# Patient Record
Sex: Female | Born: 1980 | Race: Black or African American | Hispanic: No | Marital: Married | Smoking: Never smoker
Health system: Southern US, Community
[De-identification: ages and names within clinical notes are randomized; demographics above are authoritative.]

## PROBLEM LIST (undated history)

## (undated) DIAGNOSIS — R51 Headache: Secondary | ICD-10-CM

## (undated) DIAGNOSIS — D649 Anemia, unspecified: Secondary | ICD-10-CM

## (undated) DIAGNOSIS — G4700 Insomnia, unspecified: Secondary | ICD-10-CM

---
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
## 2001-04-17 ENCOUNTER — Emergency Department (HOSPITAL_COMMUNITY): Admission: EM | Admit: 2001-04-17 | Discharge: 2001-04-17 | Payer: Self-pay | Admitting: *Deleted

## 2005-11-27 HISTORY — PX: EYE SURGERY: SHX253

## 2009-10-08 ENCOUNTER — Encounter: Payer: Self-pay | Admitting: Internal Medicine

## 2009-10-11 ENCOUNTER — Ambulatory Visit: Payer: Self-pay | Admitting: Internal Medicine

## 2009-10-11 DIAGNOSIS — D509 Iron deficiency anemia, unspecified: Secondary | ICD-10-CM

## 2009-10-11 DIAGNOSIS — I10 Essential (primary) hypertension: Secondary | ICD-10-CM | POA: Insufficient documentation

## 2009-10-11 LAB — CONVERTED CEMR LAB
Alkaline Phosphatase: 89 units/L (ref 39–117)
Basophils Absolute: 0 10*3/uL (ref 0.0–0.1)
Basophils Relative: 0.3 % (ref 0.0–3.0)
Bilirubin, Direct: 0.1 mg/dL (ref 0.0–0.3)
CO2: 26 meq/L (ref 19–32)
Calcium: 9.1 mg/dL (ref 8.4–10.5)
Creatinine, Ser: 0.7 mg/dL (ref 0.4–1.2)
Eosinophils Absolute: 0 10*3/uL (ref 0.0–0.7)
Lymphocytes Relative: 27.5 % (ref 12.0–46.0)
MCHC: 32.4 g/dL (ref 30.0–36.0)
Neutrophils Relative %: 65 % (ref 43.0–77.0)
RBC: 4.85 M/uL (ref 3.87–5.11)
RDW: 16.9 % — ABNORMAL HIGH (ref 11.5–14.6)
Specific Gravity, Urine: >=1.03 (ref 1.000–1.030)
Total Bilirubin: 0.7 mg/dL (ref 0.3–1.2)
Total Protein, Urine: NEGATIVE mg/dL
Urine Glucose: NEGATIVE mg/dL

## 2009-10-12 ENCOUNTER — Encounter: Payer: Self-pay | Admitting: Internal Medicine

## 2010-05-31 ENCOUNTER — Inpatient Hospital Stay (HOSPITAL_COMMUNITY): Admission: AD | Admit: 2010-05-31 | Discharge: 2010-05-31 | Payer: Self-pay | Admitting: Obstetrics and Gynecology

## 2010-05-31 ENCOUNTER — Ambulatory Visit: Payer: Self-pay | Admitting: Gynecology

## 2010-07-30 ENCOUNTER — Inpatient Hospital Stay (HOSPITAL_COMMUNITY): Admission: AD | Admit: 2010-07-30 | Discharge: 2010-08-01 | Payer: Self-pay | Admitting: Obstetrics and Gynecology

## 2010-08-08 ENCOUNTER — Inpatient Hospital Stay (HOSPITAL_COMMUNITY): Admission: RE | Admit: 2010-08-08 | Discharge: 2010-08-12 | Payer: Self-pay | Admitting: Obstetrics & Gynecology

## 2010-08-09 ENCOUNTER — Encounter (INDEPENDENT_AMBULATORY_CARE_PROVIDER_SITE_OTHER): Payer: Self-pay | Admitting: Obstetrics & Gynecology

## 2010-11-24 ENCOUNTER — Ambulatory Visit (HOSPITAL_COMMUNITY)
Admission: RE | Admit: 2010-11-24 | Discharge: 2010-11-24 | Payer: Self-pay | Source: Home / Self Care | Attending: Obstetrics and Gynecology | Admitting: Obstetrics and Gynecology

## 2010-11-24 ENCOUNTER — Encounter (INDEPENDENT_AMBULATORY_CARE_PROVIDER_SITE_OTHER): Payer: Self-pay | Admitting: Obstetrics and Gynecology

## 2010-11-27 HISTORY — PX: WISDOM TOOTH EXTRACTION: SHX21

## 2011-02-09 LAB — COMPREHENSIVE METABOLIC PANEL
ALT: 38 U/L — ABNORMAL HIGH (ref 0–35)
ALT: 41 U/L — ABNORMAL HIGH (ref 0–35)
ALT: 45 U/L — ABNORMAL HIGH (ref 0–35)
AST: 33 U/L (ref 0–37)
AST: 39 U/L — ABNORMAL HIGH (ref 0–37)
AST: 41 U/L — ABNORMAL HIGH (ref 0–37)
AST: 41 U/L — ABNORMAL HIGH (ref 0–37)
AST: 49 U/L — ABNORMAL HIGH (ref 0–37)
AST: 63 U/L — ABNORMAL HIGH (ref 0–37)
AST: 75 U/L — ABNORMAL HIGH (ref 0–37)
Albumin: 2.4 g/dL — ABNORMAL LOW (ref 3.5–5.2)
Albumin: 2.5 g/dL — ABNORMAL LOW (ref 3.5–5.2)
Albumin: 2.8 g/dL — ABNORMAL LOW (ref 3.5–5.2)
Albumin: 2.8 g/dL — ABNORMAL LOW (ref 3.5–5.2)
Albumin: 3 g/dL — ABNORMAL LOW (ref 3.5–5.2)
Albumin: 3.1 g/dL — ABNORMAL LOW (ref 3.5–5.2)
Alkaline Phosphatase: 148 U/L — ABNORMAL HIGH (ref 39–117)
Alkaline Phosphatase: 167 U/L — ABNORMAL HIGH (ref 39–117)
Alkaline Phosphatase: 211 U/L — ABNORMAL HIGH (ref 39–117)
BUN: 7 mg/dL (ref 6–23)
BUN: 6 mg/dL (ref 6–23)
BUN: 8 mg/dL (ref 6–23)
BUN: 9 mg/dL (ref 6–23)
CO2: 20 mEq/L (ref 19–32)
CO2: 20 mEq/L (ref 19–32)
CO2: 21 mEq/L (ref 19–32)
CO2: 22 mEq/L (ref 19–32)
CO2: 24 mEq/L (ref 19–32)
CO2: 24 mEq/L (ref 19–32)
Calcium: 8.3 mg/dL — ABNORMAL LOW (ref 8.4–10.5)
Calcium: 8.4 mg/dL (ref 8.4–10.5)
Calcium: 8.9 mg/dL (ref 8.4–10.5)
Calcium: 9 mg/dL (ref 8.4–10.5)
Calcium: 9.1 mg/dL (ref 8.4–10.5)
Calcium: 9.1 mg/dL (ref 8.4–10.5)
Calcium: 9.1 mg/dL (ref 8.4–10.5)
Chloride: 105 mEq/L (ref 96–112)
Chloride: 106 mEq/L (ref 96–112)
Chloride: 108 mEq/L (ref 96–112)
Chloride: 109 mEq/L (ref 96–112)
Creatinine, Ser: 0.63 mg/dL (ref 0.4–1.2)
Creatinine, Ser: 0.7 mg/dL (ref 0.4–1.2)
Creatinine, Ser: 0.71 mg/dL (ref 0.4–1.2)
Creatinine, Ser: 0.73 mg/dL (ref 0.4–1.2)
GFR calc Af Amer: >60 mL/min (ref >60)
GFR calc Af Amer: >60 mL/min (ref >60)
GFR calc Af Amer: >60 mL/min (ref >60)
GFR calc Af Amer: >60 mL/min (ref >60)
GFR calc Af Amer: >60 mL/min (ref >60)
GFR calc Af Amer: >60 mL/min (ref >60)
GFR calc Af Amer: >60 mL/min (ref >60)
GFR calc non Af Amer: >60 mL/min (ref >60)
GFR calc non Af Amer: >60 mL/min (ref >60)
GFR calc non Af Amer: >60 mL/min (ref >60)
GFR calc non Af Amer: >60 mL/min (ref >60)
Glucose, Bld: 103 mg/dL — ABNORMAL HIGH (ref 70–99)
Glucose, Bld: 81 mg/dL (ref 70–99)
Glucose, Bld: 87 mg/dL (ref 70–99)
Potassium: 3.5 mEq/L (ref 3.5–5.1)
Potassium: 3.7 mEq/L (ref 3.5–5.1)
Potassium: 3.8 mEq/L (ref 3.5–5.1)
Potassium: 3.8 mEq/L (ref 3.5–5.1)
Potassium: 4 mEq/L (ref 3.5–5.1)
Sodium: 137 mEq/L (ref 135–145)
Sodium: 135 mEq/L (ref 135–145)
Sodium: 135 mEq/L (ref 135–145)
Sodium: 135 mEq/L (ref 135–145)
Sodium: 137 mEq/L (ref 135–145)
Sodium: 140 mEq/L (ref 135–145)
Total Bilirubin: 0.7 mg/dL (ref 0.3–1.2)
Total Bilirubin: 0.8 mg/dL (ref 0.3–1.2)
Total Protein: 5.8 g/dL — ABNORMAL LOW (ref 6.0–8.3)
Total Protein: 6 g/dL (ref 6.0–8.3)
Total Protein: 6.1 g/dL (ref 6.0–8.3)
Total Protein: 6.5 g/dL (ref 6.0–8.3)
Total Protein: 6.7 g/dL (ref 6.0–8.3)

## 2011-02-09 LAB — CBC
HCT: 37.4 % (ref 36.0–46.0)
HCT: 38.1 % (ref 36.0–46.0)
HCT: 38.3 % (ref 36.0–46.0)
Hemoglobin: 10 g/dL — ABNORMAL LOW (ref 12.0–15.0)
Hemoglobin: 12.3 g/dL (ref 12.0–15.0)
Hemoglobin: 12.3 g/dL (ref 12.0–15.0)
Hemoglobin: 12.4 g/dL (ref 12.0–15.0)
Hemoglobin: 12.6 g/dL (ref 12.0–15.0)
Hemoglobin: 12.9 g/dL (ref 12.0–15.0)
Hemoglobin: 13.1 g/dL (ref 12.0–15.0)
MCH: 30.8 pg (ref 26.0–34.0)
MCH: 31.3 pg (ref 26.0–34.0)
MCH: 31.3 pg (ref 26.0–34.0)
MCH: 31.7 pg (ref 26.0–34.0)
MCHC: 32.7 g/dL (ref 30.0–36.0)
MCHC: 32.9 g/dL (ref 30.0–36.0)
MCHC: 32.9 g/dL (ref 30.0–36.0)
MCHC: 33.1 g/dL (ref 30.0–36.0)
MCHC: 34 g/dL (ref 30.0–36.0)
MCV: 93.7 fL (ref 78.0–100.0)
MCV: 92 fL (ref 78.0–100.0)
MCV: 92.8 fL (ref 78.0–100.0)
MCV: 92.8 fL (ref 78.0–100.0)
MCV: 93 fL (ref 78.0–100.0)
Platelets: 244 10*3/uL (ref 150–400)
Platelets: 231 10*3/uL (ref 150–400)
Platelets: 244 10*3/uL (ref 150–400)
Platelets: 244 10*3/uL (ref 150–400)
Platelets: 249 10*3/uL (ref 150–400)
RBC: 3.15 MIL/uL — ABNORMAL LOW (ref 3.87–5.11)
RBC: 4.1 MIL/uL (ref 3.87–5.11)
RBC: 4.12 MIL/uL (ref 3.87–5.11)
RBC: 4.31 MIL/uL (ref 3.87–5.11)
RDW: 15.1 % (ref 11.5–15.5)
RDW: 14.8 % (ref 11.5–15.5)
RDW: 14.8 % (ref 11.5–15.5)
RDW: 14.9 % (ref 11.5–15.5)
RDW: 15.1 % (ref 11.5–15.5)
RDW: 15.1 % (ref 11.5–15.5)
WBC: 14.3 10*3/uL — ABNORMAL HIGH (ref 4.0–10.5)
WBC: 11.9 10*3/uL — ABNORMAL HIGH (ref 4.0–10.5)
WBC: 13.2 10*3/uL — ABNORMAL HIGH (ref 4.0–10.5)
WBC: 14.7 10*3/uL — ABNORMAL HIGH (ref 4.0–10.5)
WBC: 15.7 10*3/uL — ABNORMAL HIGH (ref 4.0–10.5)

## 2011-02-09 LAB — DIFFERENTIAL
Basophils Absolute: 0.1 10*3/uL (ref 0.0–0.1)
Eosinophils Absolute: 0 10*3/uL (ref 0.0–0.7)
Eosinophils Absolute: 0 10*3/uL (ref 0.0–0.7)
Eosinophils Relative: 0 % (ref 0–5)
Eosinophils Relative: 0 % (ref 0–5)
Lymphocytes Relative: 12 % (ref 12–46)
Lymphs Abs: 1.5 10*3/uL (ref 0.7–4.0)
Monocytes Relative: 10 % (ref 3–12)

## 2011-02-09 LAB — PROTEIN, URINE, 24 HOUR
Collection Interval-UPROT: 24 hours
Protein, 24H Urine: 98 mg/d (ref 50–100)
Protein, Urine: 7 mg/dL

## 2011-02-09 LAB — URIC ACID
Uric Acid, Serum: 6.5 mg/dL (ref 2.4–7.0)
Uric Acid, Serum: 5.4 mg/dL (ref 2.4–7.0)
Uric Acid, Serum: 5.5 mg/dL (ref 2.4–7.0)
Uric Acid, Serum: 6.2 mg/dL (ref 2.4–7.0)
Uric Acid, Serum: 6.4 mg/dL (ref 2.4–7.0)

## 2011-02-09 LAB — LACTATE DEHYDROGENASE
LDH: 113 U/L (ref 94–250)
LDH: 151 U/L (ref 94–250)
LDH: 205 U/L (ref 94–250)

## 2011-02-09 LAB — CREATININE CLEARANCE, URINE, 24 HOUR: Creatinine, Urine: 123.3 mg/dL

## 2012-09-17 LAB — OB RESULTS CONSOLE HEPATITIS B SURFACE ANTIGEN: Hepatitis B Surface Ag: NEGATIVE

## 2012-09-17 LAB — OB RESULTS CONSOLE ABO/RH: RH Type: POSITIVE

## 2013-02-04 LAB — OB RESULTS CONSOLE RPR: RPR: NONREACTIVE

## 2013-04-22 ENCOUNTER — Encounter (HOSPITAL_COMMUNITY)
Admission: AD | Disposition: A | Discharge status: Home / Self Care | Payer: Self-pay | Source: Ambulatory Visit | Attending: Obstetrics and Gynecology

## 2013-04-22 ENCOUNTER — Inpatient Hospital Stay (HOSPITAL_COMMUNITY)
Admission: AD | Admit: 2013-04-22 | Discharge: 2013-04-24 | DRG: 370 | Disposition: A | Discharge status: Home / Self Care | Payer: BC Managed Care – PPO | Source: Ambulatory Visit | Attending: Obstetrics and Gynecology | Admitting: Obstetrics and Gynecology

## 2013-04-22 ENCOUNTER — Inpatient Hospital Stay (HOSPITAL_COMMUNITY): Payer: BC Managed Care – PPO | Admitting: Anesthesiology

## 2013-04-22 ENCOUNTER — Encounter (HOSPITAL_COMMUNITY): Payer: Self-pay | Admitting: *Deleted

## 2013-04-22 ENCOUNTER — Encounter (HOSPITAL_COMMUNITY): Payer: Self-pay | Admitting: Anesthesiology

## 2013-04-22 ENCOUNTER — Other Ambulatory Visit: Payer: Self-pay | Admitting: Obstetrics and Gynecology

## 2013-04-22 DIAGNOSIS — D62 Acute posthemorrhagic anemia: Secondary | ICD-10-CM | POA: Diagnosis not present

## 2013-04-22 DIAGNOSIS — I10 Essential (primary) hypertension: Secondary | ICD-10-CM

## 2013-04-22 DIAGNOSIS — O4100X0 Oligohydramnios, unspecified trimester, not applicable or unspecified: Secondary | ICD-10-CM | POA: Diagnosis present

## 2013-04-22 DIAGNOSIS — D509 Iron deficiency anemia, unspecified: Secondary | ICD-10-CM

## 2013-04-22 DIAGNOSIS — O9903 Anemia complicating the puerperium: Secondary | ICD-10-CM | POA: Diagnosis not present

## 2013-04-22 DIAGNOSIS — O34219 Maternal care for unspecified type scar from previous cesarean delivery: Principal | ICD-10-CM | POA: Diagnosis present

## 2013-04-22 DIAGNOSIS — O139 Gestational [pregnancy-induced] hypertension without significant proteinuria, unspecified trimester: Secondary | ICD-10-CM | POA: Diagnosis present

## 2013-04-22 HISTORY — DX: Anemia, unspecified: D64.9

## 2013-04-22 HISTORY — DX: Headache: R51

## 2013-04-22 HISTORY — DX: Insomnia, unspecified: G47.00

## 2013-04-22 LAB — CBC
MCV: 79.2 fL (ref 78.0–100.0)
Platelets: 319 10*3/uL (ref 150–400)
RBC: 4.19 MIL/uL (ref 3.87–5.11)
RDW: 17 % — ABNORMAL HIGH (ref 11.5–15.5)
WBC: 14.6 10*3/uL — ABNORMAL HIGH (ref 4.0–10.5)

## 2013-04-22 LAB — TYPE AND SCREEN
ABO/RH(D): O POS
Antibody Screen: NEGATIVE

## 2013-04-22 LAB — RPR: RPR Ser Ql: NONREACTIVE

## 2013-04-22 SURGERY — Surgical Case
Anesthesia: Spinal | Site: Abdomen | Wound class: Clean Contaminated

## 2013-04-22 MED ORDER — NALOXONE HCL 1 MG/ML IJ SOLN
1.0000 ug/kg/h | INTRAVENOUS | Status: DC | PRN
  Filled 2013-04-22: qty 2

## 2013-04-22 MED ORDER — KETOROLAC TROMETHAMINE 30 MG/ML IJ SOLN: 30.0000 mg | Freq: Four times a day (QID) | INTRAMUSCULAR | Status: AC | PRN

## 2013-04-22 MED ORDER — TETANUS-DIPHTH-ACELL PERTUSSIS 5-2.5-18.5 LF-MCG/0.5 IM SUSP
0.5000 mL | Freq: Once | INTRAMUSCULAR | Status: DC
  Filled 2013-04-22: qty 0.5

## 2013-04-22 MED ORDER — PRENATAL MULTIVITAMIN CH
1.0000 | ORAL_TABLET | Freq: Every day | ORAL | Status: DC
  Administered 2013-04-23 – 2013-04-24 (×2): 1 via ORAL
  Filled 2013-04-22 (×2): qty 1

## 2013-04-22 MED ORDER — BUPIVACAINE IN DEXTROSE 0.75-8.25 % IT SOLN
INTRATHECAL | Status: DC | PRN
  Administered 2013-04-22: 1.5 mL via INTRATHECAL

## 2013-04-22 MED ORDER — WITCH HAZEL-GLYCERIN EX PADS: 1.0000 "application " | MEDICATED_PAD | CUTANEOUS | Status: DC | PRN

## 2013-04-22 MED ORDER — MENTHOL 3 MG MT LOZG: 1.0000 | LOZENGE | OROMUCOSAL | Status: DC | PRN

## 2013-04-22 MED ORDER — BUPIVACAINE HCL (PF) 0.25 % IJ SOLN
INTRAMUSCULAR | Status: DC | PRN
  Administered 2013-04-22: 7 mL

## 2013-04-22 MED ORDER — FENTANYL CITRATE 0.05 MG/ML IJ SOLN
INTRAMUSCULAR | Status: DC | PRN
  Administered 2013-04-22: 15 ug via INTRATHECAL

## 2013-04-22 MED ORDER — LACTATED RINGERS IV SOLN
INTRAVENOUS | Status: DC
  Administered 2013-04-22 (×3): via INTRAVENOUS

## 2013-04-22 MED ORDER — KETOROLAC TROMETHAMINE 60 MG/2ML IM SOLN
INTRAMUSCULAR | Status: AC
  Administered 2013-04-22: 60 mg via INTRAMUSCULAR
  Filled 2013-04-22: qty 2

## 2013-04-22 MED ORDER — EPHEDRINE 5 MG/ML INJ
INTRAVENOUS | Status: AC
  Filled 2013-04-22: qty 10

## 2013-04-22 MED ORDER — LABETALOL HCL 100 MG PO TABS
100.0000 mg | ORAL_TABLET | Freq: Two times a day (BID) | ORAL | Status: DC
  Administered 2013-04-22 – 2013-04-24 (×3): 100 mg via ORAL
  Filled 2013-04-22 (×5): qty 1

## 2013-04-22 MED ORDER — OXYTOCIN 10 UNIT/ML IJ SOLN
40.0000 [IU] | INTRAVENOUS | Status: DC | PRN
  Administered 2013-04-22: 40 [IU] via INTRAVENOUS

## 2013-04-22 MED ORDER — NALOXONE HCL 0.4 MG/ML IJ SOLN: 0.4000 mg | INTRAMUSCULAR | Status: DC | PRN

## 2013-04-22 MED ORDER — ZOLPIDEM TARTRATE 5 MG PO TABS: 5.0000 mg | ORAL_TABLET | Freq: Every evening | ORAL | Status: DC | PRN

## 2013-04-22 MED ORDER — DIPHENHYDRAMINE HCL 50 MG/ML IJ SOLN: 12.5000 mg | INTRAMUSCULAR | Status: DC | PRN

## 2013-04-22 MED ORDER — PHENYLEPHRINE 40 MCG/ML (10ML) SYRINGE FOR IV PUSH (FOR BLOOD PRESSURE SUPPORT)
PREFILLED_SYRINGE | INTRAVENOUS | Status: AC
  Filled 2013-04-22: qty 10

## 2013-04-22 MED ORDER — SCOPOLAMINE 1 MG/3DAYS TD PT72: 1.0000 | MEDICATED_PATCH | Freq: Once | TRANSDERMAL | Status: DC

## 2013-04-22 MED ORDER — SIMETHICONE 80 MG PO CHEW
80.0000 mg | CHEWABLE_TABLET | Freq: Three times a day (TID) | ORAL | Status: DC
  Administered 2013-04-23 – 2013-04-24 (×5): 80 mg via ORAL

## 2013-04-22 MED ORDER — MORPHINE SULFATE (PF) 0.5 MG/ML IJ SOLN
INTRAMUSCULAR | Status: DC | PRN
  Administered 2013-04-22: .1 mg via INTRATHECAL

## 2013-04-22 MED ORDER — DIBUCAINE 1 % RE OINT: 1.0000 "application " | TOPICAL_OINTMENT | RECTAL | Status: DC | PRN

## 2013-04-22 MED ORDER — LACTATED RINGERS IV SOLN
INTRAVENOUS | Status: DC | PRN
  Administered 2013-04-22: 19:00:00 via INTRAVENOUS

## 2013-04-22 MED ORDER — SODIUM CHLORIDE 0.9 % IJ SOLN
3.0000 mL | Freq: Two times a day (BID) | INTRAMUSCULAR | Status: DC
  Administered 2013-04-23: 3 mL via INTRAVENOUS

## 2013-04-22 MED ORDER — SCOPOLAMINE 1 MG/3DAYS TD PT72
MEDICATED_PATCH | TRANSDERMAL | Status: AC
  Administered 2013-04-22: 1.5 mg via TRANSDERMAL
  Filled 2013-04-22: qty 1

## 2013-04-22 MED ORDER — ONDANSETRON HCL 4 MG/2ML IJ SOLN
INTRAMUSCULAR | Status: DC | PRN
  Administered 2013-04-22: 4 mg via INTRAVENOUS

## 2013-04-22 MED ORDER — DIPHENHYDRAMINE HCL 25 MG PO CAPS: 25.0000 mg | ORAL_CAPSULE | ORAL | Status: DC | PRN

## 2013-04-22 MED ORDER — EPHEDRINE SULFATE 50 MG/ML IJ SOLN
INTRAMUSCULAR | Status: DC | PRN
  Administered 2013-04-22: 10 mg via INTRAVENOUS
  Administered 2013-04-22 (×3): 5 mg via INTRAVENOUS
  Administered 2013-04-22 (×2): 10 mg via INTRAVENOUS
  Administered 2013-04-22: 5 mg via INTRAVENOUS

## 2013-04-22 MED ORDER — NALBUPHINE HCL 10 MG/ML IJ SOLN
5.0000 mg | INTRAMUSCULAR | Status: DC | PRN
  Filled 2013-04-22: qty 1

## 2013-04-22 MED ORDER — OXYTOCIN 40 UNITS IN LACTATED RINGERS INFUSION - SIMPLE MED: 62.5000 mL/h | INTRAVENOUS | Status: AC

## 2013-04-22 MED ORDER — ONDANSETRON HCL 4 MG/2ML IJ SOLN: 4.0000 mg | Freq: Three times a day (TID) | INTRAMUSCULAR | Status: DC | PRN

## 2013-04-22 MED ORDER — PHENYLEPHRINE 40 MCG/ML (10ML) SYRINGE FOR IV PUSH (FOR BLOOD PRESSURE SUPPORT)
PREFILLED_SYRINGE | INTRAVENOUS | Status: AC
  Filled 2013-04-22: qty 5

## 2013-04-22 MED ORDER — SENNOSIDES-DOCUSATE SODIUM 8.6-50 MG PO TABS
2.0000 | ORAL_TABLET | Freq: Every day | ORAL | Status: DC
  Administered 2013-04-23: 2 via ORAL

## 2013-04-22 MED ORDER — DEXTROSE IN LACTATED RINGERS 5 % IV SOLN: INTRAVENOUS | Status: DC

## 2013-04-22 MED ORDER — DIPHENHYDRAMINE HCL 25 MG PO CAPS: 25.0000 mg | ORAL_CAPSULE | Freq: Four times a day (QID) | ORAL | Status: DC | PRN

## 2013-04-22 MED ORDER — METOCLOPRAMIDE HCL 5 MG/ML IJ SOLN: 10.0000 mg | Freq: Three times a day (TID) | INTRAMUSCULAR | Status: DC | PRN

## 2013-04-22 MED ORDER — ONDANSETRON HCL 4 MG PO TABS: 4.0000 mg | ORAL_TABLET | ORAL | Status: DC | PRN

## 2013-04-22 MED ORDER — SIMETHICONE 80 MG PO CHEW
80.0000 mg | CHEWABLE_TABLET | ORAL | Status: DC | PRN
  Administered 2013-04-23: 80 mg via ORAL

## 2013-04-22 MED ORDER — FENTANYL CITRATE 0.05 MG/ML IJ SOLN: 25.0000 ug | INTRAMUSCULAR | Status: DC | PRN

## 2013-04-22 MED ORDER — ONDANSETRON HCL 4 MG/2ML IJ SOLN
INTRAMUSCULAR | Status: AC
  Filled 2013-04-22: qty 2

## 2013-04-22 MED ORDER — MEPERIDINE HCL 25 MG/ML IJ SOLN
INTRAMUSCULAR | Status: AC
  Administered 2013-04-22: 6.25 mg via INTRAVENOUS
  Filled 2013-04-22: qty 1

## 2013-04-22 MED ORDER — DIPHENHYDRAMINE HCL 50 MG/ML IJ SOLN: 25.0000 mg | INTRAMUSCULAR | Status: DC | PRN

## 2013-04-22 MED ORDER — SODIUM CHLORIDE 0.9 % IJ SOLN: 3.0000 mL | INTRAMUSCULAR | Status: DC | PRN

## 2013-04-22 MED ORDER — FENTANYL CITRATE 0.05 MG/ML IJ SOLN
INTRAMUSCULAR | Status: AC
  Filled 2013-04-22: qty 2

## 2013-04-22 MED ORDER — LANOLIN HYDROUS EX OINT: 1.0000 "application " | TOPICAL_OINTMENT | CUTANEOUS | Status: DC | PRN

## 2013-04-22 MED ORDER — FLEET ENEMA 7-19 GM/118ML RE ENEM: 1.0000 | ENEMA | Freq: Every day | RECTAL | Status: DC | PRN

## 2013-04-22 MED ORDER — GENTAMICIN SULFATE 40 MG/ML IJ SOLN
INTRAVENOUS | Status: AC
  Administered 2013-04-22: 100 mL via INTRAVENOUS
  Filled 2013-04-22: qty 9.25

## 2013-04-22 MED ORDER — MORPHINE SULFATE 0.5 MG/ML IJ SOLN
INTRAMUSCULAR | Status: AC
  Filled 2013-04-22: qty 10

## 2013-04-22 MED ORDER — PHENYLEPHRINE HCL 10 MG/ML IJ SOLN
INTRAMUSCULAR | Status: DC | PRN
  Administered 2013-04-22 (×3): 80 ug via INTRAVENOUS
  Administered 2013-04-22 (×2): 40 ug via INTRAVENOUS
  Administered 2013-04-22: 80 ug via INTRAVENOUS
  Administered 2013-04-22: 40 ug via INTRAVENOUS
  Administered 2013-04-22 (×4): 80 ug via INTRAVENOUS
  Administered 2013-04-22: 40 ug via INTRAVENOUS

## 2013-04-22 MED ORDER — OXYCODONE-ACETAMINOPHEN 5-325 MG PO TABS
1.0000 | ORAL_TABLET | ORAL | Status: DC | PRN
  Administered 2013-04-24: 1 via ORAL
  Filled 2013-04-22: qty 1

## 2013-04-22 MED ORDER — SODIUM CHLORIDE 0.9 % IV SOLN: 250.0000 mL | INTRAVENOUS | Status: DC

## 2013-04-22 MED ORDER — MEPERIDINE HCL 25 MG/ML IJ SOLN: 6.2500 mg | INTRAMUSCULAR | Status: DC | PRN

## 2013-04-22 MED ORDER — BISACODYL 10 MG RE SUPP: 10.0000 mg | Freq: Every day | RECTAL | Status: DC | PRN

## 2013-04-22 MED ORDER — KETOROLAC TROMETHAMINE 60 MG/2ML IM SOLN: 60.0000 mg | Freq: Once | INTRAMUSCULAR | Status: AC | PRN

## 2013-04-22 MED ORDER — OXYTOCIN 10 UNIT/ML IJ SOLN
INTRAMUSCULAR | Status: AC
  Filled 2013-04-22: qty 4

## 2013-04-22 MED ORDER — ONDANSETRON HCL 4 MG/2ML IJ SOLN: 4.0000 mg | INTRAMUSCULAR | Status: DC | PRN

## 2013-04-22 MED ORDER — BUPIVACAINE HCL (PF) 0.25 % IJ SOLN
INTRAMUSCULAR | Status: AC
  Filled 2013-04-22: qty 30

## 2013-04-22 MED ORDER — IBUPROFEN 600 MG PO TABS
600.0000 mg | ORAL_TABLET | Freq: Four times a day (QID) | ORAL | Status: DC
  Administered 2013-04-23 – 2013-04-24 (×5): 600 mg via ORAL
  Filled 2013-04-22 (×5): qty 1

## 2013-04-22 MED ORDER — FERROUS SULFATE 325 (65 FE) MG PO TABS
325.0000 mg | ORAL_TABLET | Freq: Two times a day (BID) | ORAL | Status: DC
  Administered 2013-04-23 – 2013-04-24 (×3): 325 mg via ORAL
  Filled 2013-04-22 (×3): qty 1

## 2013-04-22 SURGICAL SUPPLY — 45 items
APL SKNCLS STERI-STRIP NONHPOA (GAUZE/BANDAGES/DRESSINGS)
BARRIER ADHS 3X4 INTERCEED (GAUZE/BANDAGES/DRESSINGS) ×2 IMPLANT
BENZOIN TINCTURE PRP APPL 2/3 (GAUZE/BANDAGES/DRESSINGS) IMPLANT
BRR ADH 4X3 ABS CNTRL BYND (GAUZE/BANDAGES/DRESSINGS) ×1
CLAMP CORD UMBIL (MISCELLANEOUS) IMPLANT
CLOTH BEACON ORANGE TIMEOUT ST (SAFETY) ×2 IMPLANT
CONTAINER PREFILL 10% NBF 15ML (MISCELLANEOUS) IMPLANT
DRAPE LG THREE QUARTER DISP (DRAPES) ×2 IMPLANT
DRSG OPSITE 6X11 MED (GAUZE/BANDAGES/DRESSINGS) ×1 IMPLANT
DRSG OPSITE POSTOP 4X10 (GAUZE/BANDAGES/DRESSINGS) ×1 IMPLANT
DURAPREP 26ML APPLICATOR (WOUND CARE) ×2 IMPLANT
ELECT REM PT RETURN 9FT ADLT (ELECTROSURGICAL) ×2
ELECTRODE REM PT RTRN 9FT ADLT (ELECTROSURGICAL) ×1 IMPLANT
EXTRACTOR VACUUM M CUP 4 TUBE (SUCTIONS) IMPLANT
GLOVE BIO SURGEON STRL SZ 6.5 (GLOVE) ×2 IMPLANT
GLOVE BIOGEL PI IND STRL 7.0 (GLOVE) ×1 IMPLANT
GLOVE BIOGEL PI INDICATOR 7.0 (GLOVE) ×1
GOWN STRL REIN XL XLG (GOWN DISPOSABLE) ×4 IMPLANT
KIT ABG SYR 3ML LUER SLIP (SYRINGE) IMPLANT
NDL HYPO 25X1 1.5 SAFETY (NEEDLE) ×1 IMPLANT
NDL HYPO 25X5/8 SAFETYGLIDE (NEEDLE) IMPLANT
NEEDLE HYPO 25X1 1.5 SAFETY (NEEDLE) ×2 IMPLANT
NEEDLE HYPO 25X5/8 SAFETYGLIDE (NEEDLE) IMPLANT
NS IRRIG 1000ML POUR BTL (IV SOLUTION) ×2 IMPLANT
PACK C SECTION WH (CUSTOM PROCEDURE TRAY) ×2 IMPLANT
PAD OB MATERNITY 4.3X12.25 (PERSONAL CARE ITEMS) ×2 IMPLANT
RTRCTR C-SECT PINK 25CM LRG (MISCELLANEOUS) IMPLANT
STAPLER VISISTAT 35W (STAPLE) ×1 IMPLANT
STRIP CLOSURE SKIN 1/2X4 (GAUZE/BANDAGES/DRESSINGS) IMPLANT
SUT CHROMIC GUT AB #0 18 (SUTURE) IMPLANT
SUT MNCRL 0 VIOLET CTX 36 (SUTURE) ×3 IMPLANT
SUT MON AB 4-0 PS1 27 (SUTURE) IMPLANT
SUT MONOCRYL 0 CTX 36 (SUTURE) ×3
SUT PLAIN 2 0 (SUTURE)
SUT PLAIN 2 0 XLH (SUTURE) ×1 IMPLANT
SUT PLAIN ABS 2-0 CT1 27XMFL (SUTURE) IMPLANT
SUT VIC AB 0 CT1 27 (SUTURE) ×4
SUT VIC AB 0 CT1 27XBRD ANBCTR (SUTURE) ×2 IMPLANT
SUT VIC AB 0 CT1 36 (SUTURE) ×2 IMPLANT
SUT VIC AB 2-0 CT1 27 (SUTURE) ×2
SUT VIC AB 2-0 CT1 TAPERPNT 27 (SUTURE) ×1 IMPLANT
SYR CONTROL 10ML LL (SYRINGE) ×2 IMPLANT
TOWEL OR 17X24 6PK STRL BLUE (TOWEL DISPOSABLE) ×6 IMPLANT
TRAY FOLEY CATH 14FR (SET/KITS/TRAYS/PACK) IMPLANT
WATER STERILE IRR 1000ML POUR (IV SOLUTION) ×1 IMPLANT

## 2013-04-22 NOTE — Anesthesia Postprocedure Evaluation (Signed)
Anesthesia Post Note  Patient: Lydia Weaver  Procedure(s) Performed: Procedure(s) (LRB): CESAREAN SECTION (N/A)  Anesthesia type: Spinal  Patient location: PACU  Post pain: Pain level controlled  Post assessment: Post-op Vital signs reviewed  Last Vitals:  Filed Vitals:   04/22/13 2128  BP: 137/81  Pulse: 73  Temp: 36.6 C  Resp: 30    Post vital signs: Reviewed  Level of consciousness: awake  Complications: No apparent anesthesia complications

## 2013-04-22 NOTE — Anesthesia Preprocedure Evaluation (Signed)
Anesthesia Evaluation  Patient identified by MRN, date of birth, ID band Patient awake    Reviewed: Allergy & Precautions, H&P , NPO status , Patient's Chart, lab work & pertinent test results, reviewed documented beta blocker date and time   History of Anesthesia Complications Negative for: history of anesthetic complications  Airway Mallampati: III TM Distance: >3 FB Neck ROM: full    Dental  (+) Teeth Intact   Pulmonary neg pulmonary ROS,  breath sounds clear to auscultation        Cardiovascular hypertension (PIH), Rhythm:regular Rate:Normal + Systolic murmurs    Neuro/Psych negative neurological ROS  negative psych ROS   GI/Hepatic negative GI ROS, Neg liver ROS,   Endo/Other  negative endocrine ROS  Renal/GU negative Renal ROS  negative genitourinary   Musculoskeletal   Abdominal   Peds  Hematology  (+) anemia ,   Anesthesia Other Findings H/o prior c/s x1 under GA for cord prolapse NPO since 2145 on 5/27  Reproductive/Obstetrics (+) Pregnancy (h/o prior c/s, PIH, oligo)                           Anesthesia Physical Anesthesia Plan  ASA: III  Anesthesia Plan: Spinal   Post-op Pain Management:    Induction:   Airway Management Planned:   Additional Equipment:   Intra-op Plan:   Post-operative Plan:   Informed Consent: I have reviewed the patients History and Physical, chart, labs and discussed the procedure including the risks, benefits and alternatives for the proposed anesthesia with the patient or authorized representative who has indicated his/her understanding and acceptance.   Dental Advisory Given  Plan Discussed with: Surgeon and CRNA  Anesthesia Plan Comments:         Anesthesia Quick Evaluation

## 2013-04-22 NOTE — Anesthesia Procedure Notes (Signed)
Spinal  Patient location during procedure: OR Start time: 04/22/2013 6:38 PM Staffing Performed by: anesthesiologist  Preanesthetic Checklist Completed: patient identified, site marked, surgical consent, pre-op evaluation, timeout performed, IV checked, risks and benefits discussed and monitors and equipment checked Spinal Block Patient position: sitting Prep: site prepped and draped and DuraPrep Patient monitoring: heart rate, cardiac monitor, continuous pulse ox and blood pressure Approach: midline Location: L3-4 Injection technique: single-shot Needle Needle type: Sprotte  Needle gauge: 24 G Needle length: 9 cm Assessment Sensory level: T4 Additional Notes Clear free flow CSF on first attempt.  No paresthesia.  Patient tolerated procedure well with no apparent complications.  Jasmine December, MD

## 2013-04-22 NOTE — Brief Op Note (Signed)
04/22/2013  7:39 PM  PATIENT:  Sherrell Puller  32 y.o. female  PRE-OPERATIVE DIAGNOSIS:  Previous Cesarean Section, PIH, Mild Oligohydramnios , term gestation  POST-OPERATIVE DIAGNOSIS:  Previous Cesarean Section, PIH, Mild Oligohydramnios  Term gestation  PROCEDURE:  Repeat cesarean section, Kerr hysterotomy. Excision of hyperpigmented keloid scar  SURGEON:  Surgeon(s) and Role:    * Araseli Sherry Cathie Beams, MD - Primary      PHYSICIAN ASSISTANT:   ASSISTANTS: Olivia Mackie, MD   ANESTHESIA:   spinal Findings: live female unengaged vtx, nl tubes and ovaries. Undeveloped LUS, keloid scar with several hyperpigmented lesions  EBL:  Total I/O In: 900 [I.V.:900] Out: -   BLOOD ADMINISTERED:none  DRAINS: none   LOCAL MEDICATIONS USED:  MARCAINE     SPECIMEN:  Source of Specimen:  placenta, keloid hyperpigmented scar  DISPOSITION OF SPECIMEN:  PATHOLOGY  COUNTS:  YES  TOURNIQUET:  * No tourniquets in log *  DICTATION: .Other Dictation: Dictation Number [id number]  PLAN OF CARE: Admit to inpatient   PATIENT DISPOSITION:  PACU - hemodynamically stable.   Delay start of Pharmacological VTE agent (>24hrs) due to surgical blood loss or risk of bleeding: no

## 2013-04-22 NOTE — Transfer of Care (Signed)
Immediate Anesthesia Transfer of Care Note  Patient: Lydia Weaver  Procedure(s) Performed: Procedure(s) with comments: CESAREAN SECTION (N/A) - Repeat C/S EDD: 04/25/13  Patient Location: PACU  Anesthesia Type:Spinal  Level of Consciousness: awake  Airway & Oxygen Therapy: Patient Spontanous Breathing  Post-op Assessment: Report given to PACU RN  Post vital signs: Reviewed and stable  Complications: No apparent anesthesia complications

## 2013-04-23 ENCOUNTER — Encounter (HOSPITAL_COMMUNITY): Payer: Self-pay | Admitting: Obstetrics and Gynecology

## 2013-04-23 LAB — CBC
MCV: 79.5 fL (ref 78.0–100.0)
Platelets: 291 10*3/uL (ref 150–400)
RDW: 17 % — ABNORMAL HIGH (ref 11.5–15.5)
WBC: 12.9 10*3/uL — ABNORMAL HIGH (ref 4.0–10.5)

## 2013-04-23 NOTE — Op Note (Signed)
Lydia Weaver, Lydia Weaver               ACCOUNT NO.:  [id number]  MEDICAL RECORD NO.:  [id number]  LOCATION:  9130                          FACILITY:  [facility]  PHYSICIAN:  Maxie Better, M.D.DATE OF BIRTH:  August 10, 1981  DATE OF PROCEDURE:  04/22/2013 DATE OF DISCHARGE:                              OPERATIVE REPORT   PREOPERATIVE DIAGNOSES:  Previous cesarean section, pregnancy-induced hypertension, term gestation.  PROCEDURE:  Repeat cesarean section, Kerr hysterotomy, excision of hyperpigmented keloid scar.  POSTOPERATIVE DIAGNOSES:  Hyperpigmented keloid scar, previous cesarean section, pregnancy-induced hypertension, term gestation.  ANESTHESIA:  Spinal.  SURGEON:  Maxie Better, MD  ASSISTANT:  Lenoard Aden, MD  PROCEDURE IN DETAIL:  Under adequate spinal anesthesia, the patient was placed in the supine position with a left lateral tilt.  She was sterilely prepped and draped in usual fashion.  An indwelling Foley catheter was sterilely placed.  The patient's incision was notable for intermittent keloid formation with evidence of hyperpigmented mole-like lesion along the incision line in separate areas.  The patient was notified of this finding and the need for excision and sending specimen to Pathology.  The 0.25% Marcaine was injected along the previous Pfannenstiel skin incision.  The Pfannenstiel skin incision was made on the upper margin of the incision, carried down to the rectus fascia. Rectus fascia was opened transversely.  Rectus fascia was then bluntly and sharply dissected off the rectus muscle in superior and inferior fashion.  The rectus muscle was split in the midline.  The parietal peritoneum was entered bluntly as the rectus muscles being separated. The parietal peritoneum was then extended superiorly and inferiorly. The bladder had an undeveloped lower uterine segment.  Bladder adhesions were noted.  Careful transverse incision of the vesicouterine  peritoneum was done, however, minimal descent of the bladder peritoneum was noted. At that point, curvilinear low transverse uterine incision was then made and extended with bandage scissors.  Artificial rupture of membranes occurred.  Clear amniotic fluid noted.  Subsequent delivery of a live female infant was accomplished.  Baby was bulb suctioned on the abdomen and the cord was clamped and cut.  The baby was transferred to the awaiting pediatrician who assigned Apgars of 9 and 10 at 1 and 5 minutes.  The placenta posterior was manually removed, and was also sent to Pathology given the skin incision finding.  Uterine cavity was then cleaned of debris.  Uterine incision had no extension.  The lower portion of the incision had sharp dissection of the bladder off the lower uterine segment and displaced further inferiorly resulting in the uterine incision being closed in 2 layers, the first layer with 0 Monocryl suture, running locked stitch, followed by imbricating 0 Monocryl suture.  Figure-of-eight suture was placed in one area for bleeding and all the bleeders were cauterized.  Abdomen was then copiously irrigated, suctioned of debris.  Normal tubes and ovaries were noted bilaterally.  With good hemostasis noted, Interceed was placed in a transverse and vertical fashion in lower uterine segment.  The parietal peritoneum was then closed with 2-0 Vicryl.  The rectus fascia was closed with 0 Vicryl x2.  The subcutaneous area was irrigated with small bleeders cauterized.  The lower portion of the keloid scar was then removed with sharp dissection. Interrupted 2-0 plain sutures placed in the subcutaneous area and the skin approximated using Ethicon staples. Specimen was placenta and the excised keloid hyperpigmented scar, sent to Pathology.  ESTIMATED BLOOD LOSS:  700 mL.  INTRAOPERATIVE FLUID:  2700 mL.  URINE OUTPUT:  200 mL.  The slight blood-tinged noted at the end of the procedure.   Complication was none.  The patient tolerated the procedure well, was transferred to recovery room in stable condition.     Maxie Better, M.D.     Masury/MEDQ  D:  04/22/2013  T:  04/23/2013  Job:  [id number]

## 2013-04-23 NOTE — Progress Notes (Signed)
Patient ID: Lydia Weaver, female   DOB: 31-Dec-1980, 32 y.o.   MRN: [id number] POD # 1  Subjective: Pt reports feeling well/ Pain controlled with ibuprofen and intraop meds Tolerating po/ Foley patent, and draining clear urine/ No n/v/Flatus neg Activity: up with assistance Bleeding is light Newborn info:  Information for the patient's newborn:  Lydia, Weaver [[id number]]  female Feeding: breast   Objective: VS: Blood pressure 141/86, pulse 69, temperature 97.8 F (36.6 C), temperature source Oral, resp. rate 20.    Intake/Output Summary (Last 24 hours) at 04/23/13 0806 Last data filed at 04/23/13 0630  Gross per 24 hour  Intake   3000 ml  Output   2500 ml  Net    500 ml      Recent Labs  04/22/13 1725 04/23/13 0705  WBC 14.6* 12.9*  HGB 10.6* 9.4*  HCT 33.2* 29.4*  PLT 319 291    Blood type: --/--/O POS, O POS (05/27 1725) Rubella: Immune (10/22 0000)    Physical Exam:  General: alert, cooperative and no distress CV: Regular rate and rhythm Resp: clear Abdomen: soft, nontender, normal bowel sounds Incision: well approximated Uterine Fundus: firm, below umbilicus, nontender Lochia: minimal Ext: edema trace and Homans sign is negative, no sign of DVT    A/P: POD # 1/ G2P2002 S/P C/Section d/t elective repeat; hx PIH ABL Anemia Labile BP's; Labetalol added Doing well Continue routine post op orders   Signed: Demetrius Revel, MSN, [facility] 04/23/2013, 8:06 AM

## 2013-04-23 NOTE — Anesthesia Postprocedure Evaluation (Signed)
  Anesthesia Post-op Note  Patient: Lydia Weaver  Procedure(s) Performed: Procedure(s) with comments: CESAREAN SECTION (N/A) - Repeat C/S EDD: 04/25/13  Patient Location: Mother/Baby  Anesthesia Type:Spinal  Level of Consciousness: awake  Airway and Oxygen Therapy: Patient Spontanous Breathing  Post-op Pain: none  Post-op Assessment: Patient's Cardiovascular Status Stable, Respiratory Function Stable, Patent Airway, NAUSEA AND VOMITING PRESENT, Adequate PO intake, Pain level controlled, No headache, No backache, No residual numbness and No residual motor weakness  Post-op Vital Signs: Reviewed and stable  Complications: No apparent anesthesia complications

## 2013-04-24 MED ORDER — IBUPROFEN 600 MG PO TABS: 600.0000 mg | ORAL_TABLET | Freq: Four times a day (QID) | ORAL | Status: DC | PRN

## 2013-04-24 MED ORDER — LABETALOL HCL 100 MG PO TABS: 100.0000 mg | ORAL_TABLET | Freq: Two times a day (BID) | ORAL | Status: DC

## 2013-04-24 MED ORDER — DOCUSATE SODIUM 100 MG PO CAPS: 100.0000 mg | ORAL_CAPSULE | Freq: Two times a day (BID) | ORAL | Status: DC | PRN

## 2013-04-24 MED ORDER — OXYCODONE-ACETAMINOPHEN 5-325 MG PO TABS: 1.0000 | ORAL_TABLET | ORAL | Status: DC | PRN

## 2013-04-24 NOTE — Discharge Summary (Addendum)
Obstetric Discharge Summary Reason for Admission: G2P1001 admitted for repeat elective c/s with hx of PIH and mild oligo @ 39w 4d Prenatal Procedures: none Intrapartum Procedures: cesarean: low cervical, transverse Postpartum Procedures: none Complications-Operative and Postpartum: none Hemoglobin  Date Value Range Status  04/23/2013 9.4* 12.0 - 15.0 g/dL Final     HCT  Date Value Range Status  04/23/2013 29.4* 36.0 - 46.0 % Final    Physical Exam:  General: alert, cooperative and no distress Lochia: appropriate Uterine Fundus: soft Incision: healing well, no significant drainage, no significant erythema DVT Evaluation: No evidence of DVT seen on physical exam. Negative Homan's sign.  Discharge Diagnoses: Term Pregnancy-delivered and low transverse repeat c/s, gestational HTN  Discharge Information: Date: 04/24/2013 Activity: Pelvic rest; no lifting > 20lbs x 2 wks; increase activity daily Diet: routine Medications: Ibuprofen, colace, percocet, iron and labetalol Condition: stable Instructions: refer to practice specific booklet Discharge to: home Follow-up Information   Follow up with Kaled Allende A, MD In 6 weeks. (Staple removal and BP recheck in office on 04/29/13)    Contact information:   [address] [place] [facility] [address] [phone]       Newborn Data: Live born female on 04/22/13 Birth Weight: 6 lb 3.3 oz (2815 g) APGAR: 9, 9  Home with mother.  TEDDER, Elisha Headland, RN, BSN, FNP Student 04/24/2013, 9:12 AM  Reviewed by: Arlana Lindau, RN [facility] 04/24/13

## 2013-04-24 NOTE — Progress Notes (Addendum)
POD # 2 S/p repeat low transverse c/s Subjective: Pt reports feeling well and ready to go home/ Pain controlled with ibuprofen and percocet Tolerating po/Voiding without problems/ No n/v/Flatus positive Activity: ad lib Bleeding is light Newborn info:  Information for the patient's newborn:  Vilma, Will [[id number]]  female / Feeding: breast and bottle   Objective: VS: Blood pressure 133/80, pulse 68, temperature 98.3 F (36.8 C), temperature source Oral, resp. rate 20, height 5\' 6"  (1.676 m), weight 210 lb (95.255 kg), last menstrual period 07/13/2012, SpO2 98.00%, unknown if currently breastfeeding.    Physical Exam:  General: alert, cooperative and no distress CV: Regular rate and rhythm Resp: clear Abdomen: soft, nontender, normal bowel sounds Uterine Fundus: firm, below umbilicus, nontender Incision: covered with tegaderm honeycomb dressing, scant old blood noted, well approximated with staples intact Lochia: minimal Ext: edema trace and Homans sign is negative, no sign of DVT    A/P: POD # 2/ G2P2002/ S/P C/Section d/t elective repeat; hx PIH Mild ABL anemia Stable for discharge home today Return to office for staple removal and BP check on 6/3   Labetalol 100 mg BID Colace 100 mg TID PRN #30, 1 refill  Percocet 5/325 mg q6h PRN #20, no refills Niferex 150 mg QD  Ibuprofen 600 mg q6h PRN #30, 1 refill   Rt pp visit in 6 wks   Signed: Bruna Potter, RN, BSN, FNP Student 04/24/2013, 9:08 AM   Reviewed by: Arlana Lindau, RN, [facility] 04/24/13 939am

## 2014-06-02 ENCOUNTER — Ambulatory Visit (INDEPENDENT_AMBULATORY_CARE_PROVIDER_SITE_OTHER): Payer: BC Managed Care – PPO | Admitting: Podiatry

## 2014-06-02 ENCOUNTER — Encounter: Payer: Self-pay | Admitting: Podiatry

## 2014-06-02 ENCOUNTER — Ambulatory Visit: Payer: Self-pay | Admitting: Podiatry

## 2014-06-02 VITALS — BP 130/80 | HR 79 | Ht 65.0 in | Wt 195.0 lb

## 2014-06-02 DIAGNOSIS — M21969 Unspecified acquired deformity of unspecified lower leg: Secondary | ICD-10-CM | POA: Insufficient documentation

## 2014-06-02 DIAGNOSIS — M659 Synovitis and tenosynovitis, unspecified: Secondary | ICD-10-CM

## 2014-06-02 DIAGNOSIS — M79606 Pain in leg, unspecified: Secondary | ICD-10-CM | POA: Insufficient documentation

## 2014-06-02 DIAGNOSIS — M79604 Pain in right leg: Secondary | ICD-10-CM

## 2014-06-02 DIAGNOSIS — M79609 Pain in unspecified limb: Secondary | ICD-10-CM

## 2014-06-02 NOTE — Progress Notes (Signed)
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 When standing more than 30 minutes, right instep area hurts x duration of at least 6 months.  Off for summer, returning back to school August 17.   Objective: Dermatologic: Normal skin without visible skin lesions. Vascular: All pedal pulses are palpable. No edema or erythema noted. Neurologic: All epicritic and tactile sensations grossly intact. Orthopedic: Hypermobile first ray bilateral.  Rearfoot STJ hyperpronation bilateral. Pain at plantar medial TN joint right with abduction of rearfoot or prolonged weight bearing.  Assessment: Tenosynovitis Posterior tibial tendon right.  Hypermobile first ray R>L. Hyperpronation rearfoot bilateral.   Plan: Reviewed findings and available treatment options; orthotics, ankle brace, lace up shoe, including Lapidus procedure..  OTC Orthotic dispensed. Return for Custom orthotics.

## 2014-06-02 NOTE — Patient Instructions (Signed)
Seen for pain in right arch. Will try OTC orthotics for now. Will prepare for custom orthotics next week.

## 2014-06-09 ENCOUNTER — Ambulatory Visit (INDEPENDENT_AMBULATORY_CARE_PROVIDER_SITE_OTHER): Payer: BC Managed Care – PPO | Admitting: Podiatry

## 2014-06-09 ENCOUNTER — Encounter: Payer: Self-pay | Admitting: Podiatry

## 2014-06-09 VITALS — BP 133/83 | HR 61 | Ht 65.0 in | Wt 195.0 lb

## 2014-06-09 DIAGNOSIS — M659 Synovitis and tenosynovitis, unspecified: Secondary | ICD-10-CM

## 2014-06-09 DIAGNOSIS — M79606 Pain in leg, unspecified: Secondary | ICD-10-CM

## 2014-06-09 DIAGNOSIS — M21969 Unspecified acquired deformity of unspecified lower leg: Secondary | ICD-10-CM

## 2014-06-09 DIAGNOSIS — M216X9 Other acquired deformities of unspecified foot: Secondary | ICD-10-CM | POA: Insufficient documentation

## 2014-06-09 DIAGNOSIS — M79609 Pain in unspecified limb: Secondary | ICD-10-CM

## 2014-06-09 NOTE — Patient Instructions (Signed)
Both feet casted for custom orthotics.

## 2014-06-09 NOTE — Progress Notes (Signed)
Subjective:  Patient comes in to prepare for custom orthotics.  Objective:  Dermatologic: Normal skin without visible skin lesions.  Vascular: All pedal pulses are palpable. No edema or erythema noted. Neurologic: All epicritic and tactile sensations grossly intact.  Orthopedic:  Hypermobile first ray bilateral.  Rearfoot STJ hyperpronation bilateral.  Pain at plantar medial TN joint right with abduction of rearfoot or prolonged weight bearing.  Radiographic examination reveal increased lateral deviation angle of Calcaneocuboid angle, elevated first ray, anterior break of CYMA line, and lowering of arch height bilateral. Noted of plantar medial shifting of TNJ bilateral.   Assessment: Tenosynovitis Posterior tibial tendon right.  Hypermobile first ray R>L.  Hyperpronation rearfoot bilateral.   Plan: Both feet casted for Custom orthotics.

## 2014-08-04 ENCOUNTER — Ambulatory Visit (INDEPENDENT_AMBULATORY_CARE_PROVIDER_SITE_OTHER): Payer: BC Managed Care – PPO | Admitting: Podiatry

## 2014-08-04 ENCOUNTER — Encounter: Payer: Self-pay | Admitting: Podiatry

## 2014-08-04 DIAGNOSIS — M216X9 Other acquired deformities of unspecified foot: Secondary | ICD-10-CM

## 2014-08-04 DIAGNOSIS — M659 Synovitis and tenosynovitis, unspecified: Secondary | ICD-10-CM

## 2014-08-04 NOTE — Patient Instructions (Signed)
Orthotic follow up. Still having pain in right instep area. Will try Metatarsal binder and ankle brace. Return as needed.

## 2014-08-04 NOTE — Progress Notes (Signed)
Orthotic follow up. Still having pain in right instep area. Will try Metatarsal binder and ankle brace. Noted of tight Achilles tendon bilateral. May also benefit from stretch exercise.  Return as needed.

## 2014-09-28 ENCOUNTER — Encounter: Payer: Self-pay | Admitting: Podiatry

## 2015-08-18 ENCOUNTER — Other Ambulatory Visit: Payer: Self-pay | Admitting: Obstetrics and Gynecology

## 2015-08-19 ENCOUNTER — Encounter (HOSPITAL_COMMUNITY): Payer: Self-pay | Admitting: *Deleted

## 2015-08-19 ENCOUNTER — Inpatient Hospital Stay (HOSPITAL_COMMUNITY)
Admission: AD | Admit: 2015-08-19 | Discharge: 2015-08-19 | Disposition: A | Discharge status: Home / Self Care | Payer: BC Managed Care – PPO | Source: Ambulatory Visit | Attending: Obstetrics and Gynecology | Admitting: Obstetrics and Gynecology

## 2015-08-19 DIAGNOSIS — D649 Anemia, unspecified: Secondary | ICD-10-CM | POA: Insufficient documentation

## 2015-08-19 LAB — URINE MICROSCOPIC-ADD ON

## 2015-08-19 LAB — URINALYSIS, ROUTINE W REFLEX MICROSCOPIC
Bilirubin Urine: NEGATIVE
Glucose, UA: NEGATIVE mg/dL
Ketones, ur: NEGATIVE mg/dL
Leukocytes, UA: NEGATIVE
NITRITE: NEGATIVE
Protein, ur: NEGATIVE mg/dL
SPECIFIC GRAVITY, URINE: 1.025 (ref 1.005–1.030)
UROBILINOGEN UA: 0.2 mg/dL (ref 0.0–1.0)
pH: 6 (ref 5.0–8.0)

## 2015-08-19 LAB — POCT PREGNANCY, URINE: PREG TEST UR: NEGATIVE

## 2015-08-19 MED ORDER — SODIUM CHLORIDE 0.9 % IV SOLN
510.0000 mg | INTRAVENOUS | Status: DC
  Administered 2015-08-19: 510 mg via INTRAVENOUS
  Filled 2015-08-19: qty 17

## 2015-08-19 NOTE — MAU Note (Signed)
Patient states her iron is low and was told to come to MAU for IV iron infusion.

## 2015-08-19 NOTE — Discharge Instructions (Signed)
Iron Deficiency Anemia Anemia is a condition in which there are less red blood cells or hemoglobin in the blood than normal. Hemoglobin is the part of red blood cells that carries oxygen. Iron deficiency anemia is anemia caused by too little iron. It is the most common type of anemia. It may leave you tired and short of breath. CAUSES   Lack of iron in the diet.  Poor absorption of iron, as seen with intestinal disorders.  Intestinal bleeding.  Heavy periods. SIGNS AND SYMPTOMS  Mild anemia may not be noticeable. Symptoms may include:  Fatigue.  Headache.  Pale skin.  Weakness.  Tiredness.  Shortness of breath.  Dizziness.  Cold hands and feet.  Fast or irregular heartbeat. DIAGNOSIS  Diagnosis requires a thorough evaluation and physical exam by your health care provider. Blood tests are generally used to confirm iron deficiency anemia. Additional tests may be done to find the underlying cause of your anemia. These may include:  Testing for blood in the stool (fecal occult blood test).  A procedure to see inside the colon and rectum (colonoscopy).  A procedure to see inside the esophagus and stomach (endoscopy). TREATMENT  Iron deficiency anemia is treated by correcting the cause of the deficiency. Treatment may involve:  Adding iron-rich foods to your diet.  Taking iron supplements. Pregnant or breastfeeding women need to take extra iron because their normal diet usually does not provide the required amount.  Taking vitamins. Vitamin C improves the absorption of iron. Your health care provider may recommend that you take your iron tablets with a glass of orange juice or vitamin C supplement.  Medicines to make heavy menstrual flow lighter.  Surgery. HOME CARE INSTRUCTIONS   Take iron as directed by your health care provider.  If you cannot tolerate taking iron supplements by mouth, talk to your health care provider about taking them through a vein  (intravenously) or an injection into a muscle.  For the best iron absorption, iron supplements should be taken on an empty stomach. If you cannot tolerate them on an empty stomach, you may need to take them with food.  Do not drink milk or take antacids at the same time as your iron supplements. Milk and antacids may interfere with the absorption of iron.  Iron supplements can cause constipation. Make sure to include fiber in your diet to prevent constipation. A stool softener may also be recommended.  Take vitamins as directed by your health care provider.  Eat a diet rich in iron. Foods high in iron include liver, lean beef, whole-grain bread, eggs, dried fruit, and dark green leafy vegetables. SEEK IMMEDIATE MEDICAL CARE IF:   You faint. If this happens, do not drive. Call your local emergency services (911 in [place] if no other help is available.  You have chest pain.  You feel nauseous or vomit.  You have severe or increased shortness of breath with activity.  You feel weak.  You have a rapid heartbeat.  You have unexplained sweating.  You become light-headed when getting up from a chair or bed. MAKE SURE YOU:   Understand these instructions.  Will watch your condition.  Will get help right away if you are not doing well or get worse. Document Released: 11/10/2000 Document Revised: 11/18/2013 Document Reviewed: 07/21/2013 ExitCare Patient Information 2015 [employer]. This information is not intended to replace advice given to you by your health care provider. Make sure you discuss any questions you have with your health care provider.  

## 2015-08-26 ENCOUNTER — Encounter (HOSPITAL_COMMUNITY): Payer: Self-pay | Admitting: *Deleted

## 2015-08-26 ENCOUNTER — Inpatient Hospital Stay (HOSPITAL_COMMUNITY)
Admission: AD | Admit: 2015-08-26 | Discharge: 2015-08-26 | Disposition: A | Discharge status: Home / Self Care | Payer: BC Managed Care – PPO | Source: Ambulatory Visit | Attending: Obstetrics and Gynecology | Admitting: Obstetrics and Gynecology

## 2015-08-26 DIAGNOSIS — I10 Essential (primary) hypertension: Secondary | ICD-10-CM | POA: Insufficient documentation

## 2015-08-26 DIAGNOSIS — D509 Iron deficiency anemia, unspecified: Secondary | ICD-10-CM | POA: Diagnosis not present

## 2015-08-26 MED ORDER — SODIUM CHLORIDE 0.9 % IV SOLN
INTRAVENOUS | Status: DC
  Administered 2015-08-26: 11:00:00 via INTRAVENOUS

## 2015-08-26 MED ORDER — SODIUM CHLORIDE 0.9 % IV SOLN
510.0000 mg | INTRAVENOUS | Status: DC
  Administered 2015-08-26: 510 mg via INTRAVENOUS
  Filled 2015-08-26: qty 17

## 2015-08-26 NOTE — MAU Note (Signed)
Patient presents stating she is not pregnant and sent by her physician for an IV iron infusion. Denies pain, bleeding or discharge.

## 2015-08-26 NOTE — Discharge Instructions (Signed)
Iron-Rich Diet An iron-rich diet contains foods that are good sources of iron. Iron is an important mineral that helps your body produce hemoglobin. Hemoglobin is a protein in red blood cells that carries oxygen to the body's tissues. Sometimes, the iron level in your blood can be low. This may be caused by:  A lack of iron in your diet.  Blood loss.  Times of growth, such as during pregnancy or during a child's growth and development. Low levels of iron can cause a decrease in the number of red blood cells. This can result in iron deficiency anemia. Iron deficiency anemia symptoms include:  Tiredness.  Weakness.  Irritability.  Increased chance of infection. Here are some recommendations for daily iron intake:  Males older than 35 years of age need 8 mg of iron per day.  Women ages 19 to 50 need 18 mg of iron per day.  Pregnant women need 27 mg of iron per day, and women who are over 19 years of age and breastfeeding need 9 mg of iron per day.  Women over the age of 50 need 8 mg of iron per day. SOURCES OF IRON There are 2 types of iron that are found in food: heme iron and nonheme iron. Heme iron is absorbed by the body better than nonheme iron. Heme iron is found in meat, poultry, and fish. Nonheme iron is found in grains, beans, and vegetables. Heme Iron Sources Food / Iron (mg)  Chicken liver, 3 oz (85 g)/ 10 mg  Beef liver, 3 oz (85 g)/ 5.5 mg  Oysters, 3 oz (85 g)/ 8 mg  Beef, 3 oz (85 g)/ 2 to 3 mg  Shrimp, 3 oz (85 g)/ 2.8 mg  Turkey, 3 oz (85 g)/ 2 mg  Chicken, 3 oz (85 g) / 1 mg  Fish (tuna, halibut), 3 oz (85 g)/ 1 mg  Pork, 3 oz (85 g)/ 0.9 mg Nonheme Iron Sources Food / Iron (mg)  Ready-to-eat breakfast cereal, iron-fortified / 3.9 to 7 mg  Tofu,  cup / 3.4 mg  Kidney beans,  cup / 2.6 mg  Baked potato with skin / 2.7 mg  Asparagus,  cup / 2.2 mg  Avocado / 2 mg  Dried peaches,  cup / 1.6 mg  Raisins,  cup / 1.5 mg  Soy milk, 1 cup  / 1.5 mg  Whole-wheat bread, 1 slice / 1.2 mg  Spinach, 1 cup / 0.8 mg  Broccoli,  cup / 0.6 mg IRON ABSORPTION Certain foods can decrease the body's absorption of iron. Try to avoid these foods and beverages while eating meals with iron-containing foods:  Coffee.  Tea.  Fiber.  Soy. Foods containing vitamin C can help increase the amount of iron your body absorbs from iron sources, especially from nonheme sources. Eat foods with vitamin C along with iron-containing foods to increase your iron absorption. Foods that are high in vitamin C include many fruits and vegetables. Some good sources are:  Fresh orange juice.  Oranges.  Strawberries.  Mangoes.  Grapefruit.  Red bell peppers.  Green bell peppers.  Broccoli.  Potatoes with skin.  Tomato juice. Document Released: 06/27/2005 Document Revised: 02/05/2012 Document Reviewed: 05/04/2011 ExitCare Patient Information 2015 [employer]. This information is not intended to replace advice given to you by your health care provider. Make sure you discuss any questions you have with your health care provider.  

## 2020-02-17 ENCOUNTER — Other Ambulatory Visit: Payer: Self-pay | Admitting: Obstetrics and Gynecology

## 2020-02-17 DIAGNOSIS — R928 Other abnormal and inconclusive findings on diagnostic imaging of breast: Secondary | ICD-10-CM

## 2020-03-01 ENCOUNTER — Other Ambulatory Visit: Payer: Self-pay

## 2020-03-01 ENCOUNTER — Other Ambulatory Visit: Payer: Self-pay | Admitting: Obstetrics and Gynecology

## 2020-03-01 ENCOUNTER — Ambulatory Visit
Admission: RE | Admit: 2020-03-01 | Discharge: 2020-03-01 | Disposition: A | Discharge status: Home / Self Care | Payer: BC Managed Care – PPO | Source: Ambulatory Visit | Attending: Obstetrics and Gynecology | Admitting: Obstetrics and Gynecology

## 2020-03-01 DIAGNOSIS — N6320 Unspecified lump in the left breast, unspecified quadrant: Secondary | ICD-10-CM

## 2020-03-01 DIAGNOSIS — R928 Other abnormal and inconclusive findings on diagnostic imaging of breast: Secondary | ICD-10-CM

## 2020-03-11 ENCOUNTER — Ambulatory Visit
Admission: RE | Admit: 2020-03-11 | Discharge: 2020-03-11 | Disposition: A | Discharge status: Home / Self Care | Payer: BC Managed Care – PPO | Source: Ambulatory Visit | Attending: Obstetrics and Gynecology | Admitting: Obstetrics and Gynecology

## 2020-03-11 ENCOUNTER — Other Ambulatory Visit: Payer: Self-pay | Admitting: Obstetrics and Gynecology

## 2020-03-11 ENCOUNTER — Other Ambulatory Visit: Payer: Self-pay

## 2020-03-11 DIAGNOSIS — N6320 Unspecified lump in the left breast, unspecified quadrant: Secondary | ICD-10-CM

## 2020-04-05 ENCOUNTER — Other Ambulatory Visit: Payer: Self-pay | Admitting: Surgery

## 2020-05-24 ENCOUNTER — Other Ambulatory Visit: Payer: Self-pay

## 2020-05-24 ENCOUNTER — Encounter (HOSPITAL_BASED_OUTPATIENT_CLINIC_OR_DEPARTMENT_OTHER): Payer: Self-pay | Admitting: Surgery

## 2020-05-25 MED ORDER — ENSURE PRE-SURGERY PO LIQD: 296.0000 mL | Freq: Once | ORAL | Status: DC

## 2020-05-25 NOTE — Progress Notes (Signed)

## 2020-05-28 ENCOUNTER — Other Ambulatory Visit (HOSPITAL_COMMUNITY)
Admission: RE | Admit: 2020-05-28 | Discharge: 2020-05-28 | Disposition: A | Discharge status: Home / Self Care | Payer: BC Managed Care – PPO | Source: Ambulatory Visit | Attending: Surgery | Admitting: Surgery

## 2020-05-28 DIAGNOSIS — Z01812 Encounter for preprocedural laboratory examination: Secondary | ICD-10-CM | POA: Insufficient documentation

## 2020-05-28 DIAGNOSIS — Z20822 Contact with and (suspected) exposure to covid-19: Secondary | ICD-10-CM | POA: Insufficient documentation

## 2020-05-28 LAB — SARS CORONAVIRUS 2 (TAT 6-24 HRS): SARS Coronavirus 2: NEGATIVE

## 2020-05-31 ENCOUNTER — Encounter (HOSPITAL_BASED_OUTPATIENT_CLINIC_OR_DEPARTMENT_OTHER): Payer: Self-pay | Admitting: Surgery

## 2020-05-31 NOTE — H&P (Signed)
Lydia Weaver  Location: [facility] Patient #: [id number] DOB: December 17, 1980 Undefined / Language: Cleophus Molt / Race: Black or African American Female   History of Present Illness  The patient is a 39 year old female who presents with a breast mass.   Chief complaint: Left breast mass  This is a 39 year old female who had  screening mammogram recently. She was found to have possible masses in her left breast. 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 She underwent follow-up imaging which showed 3 separate masses in the left breast. The largest was 3.6 cm it was both cystic and solid in the upper outer quadrant at 1:00, 3 cm from the nipple. The other 2 masses were in the upper inner quadrant and adjacent to the first mass. She underwent biopsy of 3 masses. All 3 showed fibroadenoma. The largest mass biopsy was felt to be discordant. Excision of this area as recommended by radiology throat malignancy. She denies nipple discharge. She has a family history of breast cancer in her paternal grandmother. She is otherwise healthy without complaints.   Past Surgical History  Breast Biopsy  Bilateral. Cesarean Section - 1  Oral Surgery   Diagnostic Studies History  Colonoscopy  never Mammogram  within last year Pap Smear  1-5 years ago  Allergies Penicillins   Medication History (Chemira Jones, CMA;  Kyleena (19.5MG  IUD, Intrauterine) Active. Medications Reconciled  Pregnancy / Birth History Malachi Bonds, CMA; Age at menarche  74 years. Contraceptive History  Intrauterine device, Oral contraceptives. Gravida  2 Length (months) of breastfeeding  3-6 Maternal age  62-30 Para  2 Regular periods   Other Problems Malachi Bonds, CMA; 04/05/2020 10:14 AM) Lump In Breast     Review of Systems Malachi Bonds CMA; General Not Present- Appetite Loss, Chills, Fatigue, Fever, Night Sweats, Weight Gain and  Weight Loss. Skin Not Present- Change in Wart/Mole, Dryness, Hives, Jaundice, New Lesions, Non-Healing Wounds, Rash and Ulcer. HEENT Not Present- Earache, Hearing Loss, Hoarseness, Nose Bleed, Oral Ulcers, Ringing in the Ears, Seasonal Allergies, Sinus Pain, Sore Throat, Visual Disturbances, Wears glasses/contact lenses and Yellow Eyes. Respiratory Not Present- Bloody sputum, Chronic Cough, Difficulty Breathing, Snoring and Wheezing. Breast Present- Breast Mass. Not Present- Breast Pain, Nipple Discharge and Skin Changes. Cardiovascular Not Present- Chest Pain, Difficulty Breathing Lying Down, Leg Cramps, Palpitations, Rapid Heart Rate, Shortness of Breath and Swelling of Extremities. Gastrointestinal Not Present- Abdominal Pain, Bloating, Bloody Stool, Change in Bowel Habits, Chronic diarrhea, Constipation, Difficulty Swallowing, Excessive gas, Gets full quickly at meals, Hemorrhoids, Indigestion, Nausea, Rectal Pain and Vomiting. Female Genitourinary Not Present- Frequency, Nocturia, Painful Urination, Pelvic Pain and Urgency. Musculoskeletal Not Present- Back Pain, Joint Pain, Joint Stiffness, Muscle Pain, Muscle Weakness and Swelling of Extremities. Neurological Not Present- Decreased Memory, Fainting, Headaches, Numbness, Seizures, Tingling, Tremor, Trouble walking and Weakness. Psychiatric Not Present- Anxiety, Bipolar, Change in Sleep Pattern, Depression, Fearful and Frequent crying. Endocrine Not Present- Cold Intolerance, Excessive Hunger, Hair Changes, Heat Intolerance, Hot flashes and New Diabetes. Hematology Present- Easy Bruising. Not Present- Blood Thinners, Excessive bleeding, Gland problems, HIV and Persistent Infections.  Vitals  Weight: 205.2 lb Height: 65in Body Surface Area: 2 m Body Mass Index: 34.15 kg/m  Pulse: 69 (Regular)  BP: 112/70(Sitting, Left Arm, Standard)     Physical Exam  The physical exam findings are as follows: Note: She appears well  exam.  There is no axillary adenopathy.  On the left breast, there is  a mobile superficial palpable mass in the upper outer quadrant in the area of concern measuring about 2-3 cm. No other masses could be palpated. The nipple areolar complex is normal. The right breast is normal    Assessment & Plan   Impression: I extensively reviewed the patient's mammograms and ultrasounds as well as pathology results. I gave her a copy of the pathology results. It is recommended that she undergo a left breast lumpectomy to remove the large palpable mass in the upper outer quadrant left breast for complete histologic evaluation to rule malignancy as the biopsy is felt to be discordant. This is recommended also given her family history of breast cancer. I discussed the reasons for this with her in detail. I discussed proceeding with a left breast lumpectomy. I discussed the risk of surgery in detail. These include but are not limited to bleeding, infection, injury to surrounding structures, the need for further procedures if malignancy is found, cardiopulmonary issues, postoperative recovery, etc. She understands and wishes to proceed with surgery.

## 2020-06-01 ENCOUNTER — Ambulatory Visit (HOSPITAL_BASED_OUTPATIENT_CLINIC_OR_DEPARTMENT_OTHER): Payer: BC Managed Care – PPO | Admitting: Certified Registered Nurse Anesthetist

## 2020-06-01 ENCOUNTER — Ambulatory Visit (HOSPITAL_BASED_OUTPATIENT_CLINIC_OR_DEPARTMENT_OTHER)
Admission: RE | Admit: 2020-06-01 | Discharge: 2020-06-01 | Disposition: A | Discharge status: Home / Self Care | Payer: BC Managed Care – PPO | Attending: Surgery | Admitting: Surgery

## 2020-06-01 ENCOUNTER — Encounter (HOSPITAL_BASED_OUTPATIENT_CLINIC_OR_DEPARTMENT_OTHER)
Admission: RE | Disposition: A | Discharge status: Home / Self Care | Payer: Self-pay | Source: Home / Self Care | Attending: Surgery

## 2020-06-01 ENCOUNTER — Encounter (HOSPITAL_BASED_OUTPATIENT_CLINIC_OR_DEPARTMENT_OTHER): Payer: Self-pay | Admitting: Surgery

## 2020-06-01 ENCOUNTER — Other Ambulatory Visit: Payer: Self-pay

## 2020-06-01 DIAGNOSIS — Z803 Family history of malignant neoplasm of breast: Secondary | ICD-10-CM | POA: Diagnosis not present

## 2020-06-01 DIAGNOSIS — I10 Essential (primary) hypertension: Secondary | ICD-10-CM | POA: Insufficient documentation

## 2020-06-01 DIAGNOSIS — D242 Benign neoplasm of left breast: Secondary | ICD-10-CM | POA: Diagnosis not present

## 2020-06-01 DIAGNOSIS — N6320 Unspecified lump in the left breast, unspecified quadrant: Secondary | ICD-10-CM | POA: Diagnosis present

## 2020-06-01 HISTORY — PX: BREAST LUMPECTOMY: SHX2

## 2020-06-01 LAB — POCT PREGNANCY, URINE: Preg Test, Ur: NEGATIVE

## 2020-06-01 SURGERY — BREAST LUMPECTOMY
Anesthesia: General | Site: Breast | Laterality: Left

## 2020-06-01 MED ORDER — DEXAMETHASONE SODIUM PHOSPHATE 10 MG/ML IJ SOLN
INTRAMUSCULAR | Status: DC | PRN
  Administered 2020-06-01: 5 mg via INTRAVENOUS

## 2020-06-01 MED ORDER — LIDOCAINE 2% (20 MG/ML) 5 ML SYRINGE
INTRAMUSCULAR | Status: AC
  Filled 2020-06-01: qty 5

## 2020-06-01 MED ORDER — CIPROFLOXACIN IN D5W 400 MG/200ML IV SOLN
INTRAVENOUS | Status: AC
  Filled 2020-06-01: qty 200

## 2020-06-01 MED ORDER — CHLORHEXIDINE GLUCONATE CLOTH 2 % EX PADS: 6.0000 | MEDICATED_PAD | Freq: Once | CUTANEOUS | Status: DC

## 2020-06-01 MED ORDER — MEPERIDINE HCL 25 MG/ML IJ SOLN: 6.2500 mg | INTRAMUSCULAR | Status: DC | PRN

## 2020-06-01 MED ORDER — BUPIVACAINE HCL (PF) 0.5 % IJ SOLN
INTRAMUSCULAR | Status: DC | PRN
  Administered 2020-06-01: 20 mL

## 2020-06-01 MED ORDER — PROPOFOL 10 MG/ML IV BOLUS
INTRAVENOUS | Status: DC | PRN
  Administered 2020-06-01: 200 mg via INTRAVENOUS

## 2020-06-01 MED ORDER — ACETAMINOPHEN 500 MG PO TABS
1000.0000 mg | ORAL_TABLET | ORAL | Status: AC
  Administered 2020-06-01: 1000 mg via ORAL

## 2020-06-01 MED ORDER — CELECOXIB 200 MG PO CAPS
400.0000 mg | ORAL_CAPSULE | ORAL | Status: AC
  Administered 2020-06-01: 400 mg via ORAL

## 2020-06-01 MED ORDER — ONDANSETRON HCL 4 MG/2ML IJ SOLN
INTRAMUSCULAR | Status: AC
  Filled 2020-06-01: qty 2

## 2020-06-01 MED ORDER — ARTIFICIAL TEARS OPHTHALMIC OINT
TOPICAL_OINTMENT | OPHTHALMIC | Status: AC
  Filled 2020-06-01: qty 3.5

## 2020-06-01 MED ORDER — GABAPENTIN 300 MG PO CAPS
ORAL_CAPSULE | ORAL | Status: AC
  Filled 2020-06-01: qty 1

## 2020-06-01 MED ORDER — OXYCODONE HCL 5 MG/5ML PO SOLN: 5.0000 mg | Freq: Once | ORAL | Status: DC | PRN

## 2020-06-01 MED ORDER — GABAPENTIN 300 MG PO CAPS
300.0000 mg | ORAL_CAPSULE | ORAL | Status: AC
  Administered 2020-06-01: 300 mg via ORAL

## 2020-06-01 MED ORDER — PROMETHAZINE HCL 25 MG/ML IJ SOLN: 6.2500 mg | INTRAMUSCULAR | Status: DC | PRN

## 2020-06-01 MED ORDER — MIDAZOLAM HCL 2 MG/2ML IJ SOLN
INTRAMUSCULAR | Status: AC
  Filled 2020-06-01: qty 2

## 2020-06-01 MED ORDER — OXYCODONE HCL 5 MG PO TABS: 5.0000 mg | ORAL_TABLET | Freq: Four times a day (QID) | ORAL | 0 refills | Status: AC | PRN

## 2020-06-01 MED ORDER — CIPROFLOXACIN IN D5W 400 MG/200ML IV SOLN
400.0000 mg | INTRAVENOUS | Status: AC
  Administered 2020-06-01: 400 mg via INTRAVENOUS

## 2020-06-01 MED ORDER — BUPIVACAINE HCL (PF) 0.5 % IJ SOLN
INTRAMUSCULAR | Status: AC
  Filled 2020-06-01: qty 30

## 2020-06-01 MED ORDER — PROPOFOL 10 MG/ML IV BOLUS
INTRAVENOUS | Status: AC
  Filled 2020-06-01: qty 20

## 2020-06-01 MED ORDER — GLYCOPYRROLATE 0.2 MG/ML IJ SOLN
INTRAMUSCULAR | Status: AC
  Filled 2020-06-01: qty 3

## 2020-06-01 MED ORDER — LIDOCAINE HCL (CARDIAC) PF 100 MG/5ML IV SOSY
PREFILLED_SYRINGE | INTRAVENOUS | Status: DC | PRN
  Administered 2020-06-01: 60 mg via INTRATRACHEAL

## 2020-06-01 MED ORDER — ACETAMINOPHEN 500 MG PO TABS
ORAL_TABLET | ORAL | Status: AC
  Filled 2020-06-01: qty 2

## 2020-06-01 MED ORDER — LACTATED RINGERS IV SOLN: INTRAVENOUS | Status: DC

## 2020-06-01 MED ORDER — GLYCOPYRROLATE 0.2 MG/ML IJ SOLN
INTRAMUSCULAR | Status: DC | PRN
Start: 2020-06-01 — End: 2020-06-01
  Administered 2020-06-01: .2 mg via INTRAVENOUS

## 2020-06-01 MED ORDER — CELECOXIB 200 MG PO CAPS
ORAL_CAPSULE | ORAL | Status: AC
  Filled 2020-06-01: qty 2

## 2020-06-01 MED ORDER — MIDAZOLAM HCL 2 MG/2ML IJ SOLN
INTRAMUSCULAR | Status: DC | PRN
  Administered 2020-06-01: 2 mg via INTRAVENOUS

## 2020-06-01 MED ORDER — FENTANYL CITRATE (PF) 100 MCG/2ML IJ SOLN
INTRAMUSCULAR | Status: AC
  Filled 2020-06-01: qty 2

## 2020-06-01 MED ORDER — ARTIFICIAL TEARS OPHTHALMIC OINT
TOPICAL_OINTMENT | OPHTHALMIC | Status: DC | PRN
  Administered 2020-06-01: 1 via OPHTHALMIC

## 2020-06-01 MED ORDER — OXYCODONE HCL 5 MG PO TABS: 5.0000 mg | ORAL_TABLET | Freq: Once | ORAL | Status: DC | PRN

## 2020-06-01 MED ORDER — FENTANYL CITRATE (PF) 100 MCG/2ML IJ SOLN
INTRAMUSCULAR | Status: DC | PRN
  Administered 2020-06-01: 50 ug via INTRAVENOUS

## 2020-06-01 MED ORDER — DEXAMETHASONE SODIUM PHOSPHATE 10 MG/ML IJ SOLN
INTRAMUSCULAR | Status: AC
  Filled 2020-06-01: qty 1

## 2020-06-01 MED ORDER — HYDROMORPHONE HCL 1 MG/ML IJ SOLN: 0.2500 mg | INTRAMUSCULAR | Status: DC | PRN

## 2020-06-01 MED ORDER — ONDANSETRON HCL 4 MG/2ML IJ SOLN
INTRAMUSCULAR | Status: DC | PRN
Start: 2020-06-01 — End: 2020-06-01
  Administered 2020-06-01: 4 mg via INTRAVENOUS

## 2020-06-01 SURGICAL SUPPLY — 36 items
ADH SKN CLS APL DERMABOND .7 (GAUZE/BANDAGES/DRESSINGS) ×1
APL PRP STRL LF DISP 70% ISPRP (MISCELLANEOUS) ×1
BINDER BREAST XLRG (GAUZE/BANDAGES/DRESSINGS) ×2 IMPLANT
BLADE SURG 15 STRL LF DISP TIS (BLADE) ×1 IMPLANT
BLADE SURG 15 STRL SS (BLADE) ×3
CHLORAPREP W/TINT 26 (MISCELLANEOUS) ×3 IMPLANT
COVER BACK TABLE 60X90IN (DRAPES) ×3 IMPLANT
COVER MAYO STAND STRL (DRAPES) ×3 IMPLANT
DERMABOND ADVANCED (GAUZE/BANDAGES/DRESSINGS) ×2
DERMABOND ADVANCED .7 DNX12 (GAUZE/BANDAGES/DRESSINGS) ×1 IMPLANT
DRAPE LAPAROTOMY 100X72 PEDS (DRAPES) ×3 IMPLANT
DRAPE UTILITY XL STRL (DRAPES) ×3 IMPLANT
ELECT REM PT RETURN 9FT ADLT (ELECTROSURGICAL) ×3
ELECTRODE REM PT RTRN 9FT ADLT (ELECTROSURGICAL) ×1 IMPLANT
GAUZE SPONGE 4X4 12PLY STRL LF (GAUZE/BANDAGES/DRESSINGS) ×3 IMPLANT
GLOVE BIOGEL PI IND STRL 7.0 (GLOVE) IMPLANT
GLOVE BIOGEL PI INDICATOR 7.0 (GLOVE) ×4
GLOVE SURG SIGNA 7.5 PF LTX (GLOVE) ×3 IMPLANT
GLOVE SURG SS PI 7.0 STRL IVOR (GLOVE) ×2 IMPLANT
GOWN STRL REUS W/ TWL LRG LVL3 (GOWN DISPOSABLE) ×1 IMPLANT
GOWN STRL REUS W/ TWL XL LVL3 (GOWN DISPOSABLE) ×1 IMPLANT
GOWN STRL REUS W/TWL LRG LVL3 (GOWN DISPOSABLE) ×3
GOWN STRL REUS W/TWL XL LVL3 (GOWN DISPOSABLE) ×3
KIT MARKER MARGIN INK (KITS) ×3 IMPLANT
NDL HYPO 25X1 1.5 SAFETY (NEEDLE) ×1 IMPLANT
NEEDLE HYPO 25X1 1.5 SAFETY (NEEDLE) ×3 IMPLANT
NS IRRIG 1000ML POUR BTL (IV SOLUTION) ×3 IMPLANT
PACK BASIN DAY SURGERY FS (CUSTOM PROCEDURE TRAY) ×3 IMPLANT
PENCIL SMOKE EVACUATOR (MISCELLANEOUS) ×3 IMPLANT
SLEEVE SCD COMPRESS KNEE MED (MISCELLANEOUS) ×2 IMPLANT
SPONGE LAP 4X18 RFD (DISPOSABLE) ×3 IMPLANT
SUT MNCRL AB 4-0 PS2 18 (SUTURE) ×3 IMPLANT
SUT VIC AB 3-0 SH 27 (SUTURE) ×3
SUT VIC AB 3-0 SH 27X BRD (SUTURE) ×1 IMPLANT
SYR CONTROL 10ML LL (SYRINGE) ×3 IMPLANT
TOWEL GREEN STERILE FF (TOWEL DISPOSABLE) ×3 IMPLANT

## 2020-06-01 NOTE — Discharge Instructions (Signed)
[place] Office Phone Number [phone]  BREAST BIOPSY/ PARTIAL MASTECTOMY: POST OP INSTRUCTIONS  Always review your discharge instruction sheet given to you by the facility where your surgery was performed.  IF YOU HAVE DISABILITY OR FAMILY LEAVE FORMS, YOU MUST BRING THEM TO THE OFFICE FOR PROCESSING.  DO NOT GIVE THEM TO YOUR DOCTOR.  1. A prescription for pain medication may be given to you upon discharge.  Take your pain medication as prescribed, if needed.  If narcotic pain medicine is not needed, then you may take acetaminophen (Tylenol) or ibuprofen (Advil) as needed. 2. Take your usually prescribed medications unless otherwise directed 3. If you need a refill on your pain medication, please contact your pharmacy.  They will contact our office to request authorization.  Prescriptions will not be filled after 5pm or on week-ends. 4. You should eat very light the first 24 hours after surgery, such as soup, crackers, pudding, etc.  Resume your normal diet the day after surgery. 5. Most patients will experience some swelling and bruising in the breast.  Ice packs and a good support bra will help.  Swelling and bruising can take several days to resolve.  6. It is common to experience some constipation if taking pain medication after surgery.  Increasing fluid intake and taking a stool softener will usually help or prevent this problem from occurring.  A mild laxative (Milk of Magnesia or Miralax) should be taken according to package directions if there are no bowel movements after 48 hours. 7. Unless discharge instructions indicate otherwise, you may remove your bandages 24-48 hours after surgery, and you may shower at that time.  You may have steri-strips (small skin tapes) in place directly over the incision.  These strips should be left on the skin for 7-10 days.  If your surgeon used skin glue on the incision, you may shower in 24 hours.  The glue will flake off over the  next 2-3 weeks.  Any sutures or staples will be removed at the office during your follow-up visit. 8. ACTIVITIES:  You may resume regular daily activities (gradually increasing) beginning the next day.  Wearing a good support bra or sports bra minimizes pain and swelling.  You may have sexual intercourse when it is comfortable. a. You may drive when you no longer are taking prescription pain medication, you can comfortably wear a seatbelt, and you can safely maneuver your car and apply brakes. b. RETURN TO WORK:  ______________________________________________________________________________________ 9. You should see your doctor in the office for a follow-up appointment approximately two weeks after your surgery.  Your doctor's nurse will typically make your follow-up appointment when she calls you with your pathology report.  Expect your pathology report 2-3 business days after your surgery.  You may call to check if you do not hear from [place] after three days. 10. OTHER INSTRUCTIONS:OK TO REMOVE THE BINDER AND START SHOWERING TOMORROW 11. ICE PACK, TYLENOL, AND IBUPROFEN ALSO FOR PAIN 12. NO VIGOROUS ACTIVITY FOR ONE WEEK _______________________________________________________________________________________________ _____________________________________________________________________________________________________________________________________ _____________________________________________________________________________________________________________________________________ _____________________________________________________________________________________________________________________________________  WHEN TO CALL YOUR DOCTOR: 1. Fever over 101.0 2. Nausea and/or vomiting. 3. Extreme swelling or bruising. 4. Continued bleeding from incision. 5. Increased pain, redness, or drainage from the incision.  The clinic staff is available to answer your questions during regular business hours.   Please don't hesitate to call and ask to speak to one of the nurses for clinical concerns.  If you have a medical emergency, go to the nearest emergency room or  call 911.  A surgeon from [facility] is always on call at the hospital.  For further questions, please visit [email or web address]      Post Anesthesia Home Care Instructions  Activity: Get plenty of rest for the remainder of the day. A responsible individual must stay with you for 24 hours following the procedure.  For the next 24 hours, DO NOT: -Drive a car -Paediatric nurse -Drink alcoholic beverages -Take any medication unless instructed by your physician -Make any legal decisions or sign important papers.  Meals: Start with liquid foods such as gelatin or soup. Progress to regular foods as tolerated. Avoid greasy, spicy, heavy foods. If nausea and/or vomiting occur, drink only clear liquids until the nausea and/or vomiting subsides. Call your physician if vomiting continues.  Special Instructions/Symptoms: Your throat may feel dry or sore from the anesthesia or the breathing tube placed in your throat during surgery. If this causes discomfort, gargle with warm salt water. The discomfort should disappear within 24 hours.    Next dose of Tylenol can be given at 1:00pm if needed. Next dose of Ibuprofen/Motrin can be given at 3:00pm if needed.

## 2020-06-01 NOTE — Interval H&P Note (Signed)
History and Physical Interval Note: no change in H and P  06/01/2020 7:07 AM  Lydia Weaver  has presented today for surgery, with the diagnosis of LEFT BREAST MASS.  The various methods of treatment have been discussed with the patient and family. After consideration of risks, benefits and other options for treatment, the patient has consented to  Procedure(s): LEFT BREAST LUMPECTOMY (Left) as a surgical intervention.  The patient's history has been reviewed, patient examined, no change in status, stable for surgery.  I have reviewed the patient's chart and labs.  Questions were answered to the patient's satisfaction.     Coralie Keens

## 2020-06-01 NOTE — Anesthesia Procedure Notes (Signed)
Procedure Name: LMA Insertion Date/Time: 06/01/2020 7:25 AM Performed by: Collier Bullock, CRNA Pre-anesthesia Checklist: Patient identified, Emergency Drugs available, Suction available and Patient being monitored Patient Re-evaluated:Patient Re-evaluated prior to induction Oxygen Delivery Method: Circle system utilized Preoxygenation: Pre-oxygenation with 100% oxygen Induction Type: IV induction Ventilation: Mask ventilation without difficulty LMA: LMA inserted LMA Size: 4.0 Number of attempts: 1 Placement Confirmation: positive ETCO2 and breath sounds checked- equal and bilateral Tube secured with: Tape Dental Injury: Teeth and Oropharynx as per pre-operative assessment

## 2020-06-01 NOTE — Op Note (Signed)
LEFT BREAST LUMPECTOMY  Procedure Note  Lydia Weaver 06/01/2020   Pre-op Diagnosis: LEFT BREAST MASS     Post-op Diagnosis: same  Procedure(s): LEFT BREAST LUMPECTOMY  Surgeon(s): Coralie Keens, MD  Anesthesia: General  Staff:  Circulator: Izora Ribas, RN Scrub Person: Murvin Natal  Estimated Blood Loss: Minimal               Specimens: sent to path  Indications: This is a 39 year old female with a history of fibroadenomas of the breast who was found to have multiple left breast masses.  She underwent multiple biopsies.  The largest mass was almost 4 cm in size.  Biopsy showed this also to be a fibroadenoma but was felt to be discordant given the cystic and solid nature of the mass on ultrasound.  Lumpectomy was recommended  Procedure: The patient was brought to the operating room and identifies correct patient.  She is placed upon the operating table general anesthesia was induced.  Her left breast was prepped and draped in usual sterile fashion.  I anesthetized the upper outer edge of the areola with Marcaine and then made a circumareolar incision with a scalpel.  I then dissected down to the breast tissue with electrocautery.  The mass itself was in the deep breast tissue near the chest wall.  I dissected slightly laterally and then posteriorly to identify the mass.  It was quite nodular.  I performed a lumpectomy staying around the mass and going down to the chest wall with the cautery.  I then completely remove the specimen.  I marked the margins with pain.  It was then sent to pathology for evaluation.  I achieved hemostasis with cautery.  I anesthetized the incision further with Marcaine.  I then closed the subcutaneous tissue with interrupted 3-0 Vicryl sutures and closed the skin with a running 4-0 Monocryl.  Dermabond was applied.  The patient tolerated the procedure well.  All the counts were correct at the end of the procedure.  The patient was then extubated  in the operating room and taken in stable condition to the recovery room.  She was placed in a breast binder.          Coralie Keens   Date: 06/01/2020  Time: 7:56 AM

## 2020-06-01 NOTE — Transfer of Care (Signed)
Immediate Anesthesia Transfer of Care Note  Patient: Lydia Weaver  Procedure(s) Performed: LEFT BREAST LUMPECTOMY (Left Breast)  Patient Location: PACU  Anesthesia Type:General  Level of Consciousness: awake, alert , drowsy and patient cooperative  Airway & Oxygen Therapy: Patient Spontanous Breathing and Patient connected to face mask oxygen  Post-op Assessment: Report given to RN, Post -op Vital signs reviewed and stable, Patient moving all extremities X 4 and Patient able to stick tongue midline  Post vital signs: Reviewed and stable  Last Vitals:  Vitals Value Taken Time  BP 142/97 06/01/20 0804  Temp    Pulse 90 06/01/20 0805  Resp 22 06/01/20 0805  SpO2 100 % 06/01/20 0805  Vitals shown include unvalidated device data.  Last Pain:  Vitals:   06/01/20 0646  TempSrc: Oral  PainSc: 0-No pain         Complications: No complications documented.

## 2020-06-01 NOTE — Anesthesia Preprocedure Evaluation (Signed)
Anesthesia Evaluation  Patient identified by MRN, date of birth, ID band Patient awake    Reviewed: Allergy & Precautions, H&P , NPO status , Patient's Chart, lab work & pertinent test results, reviewed documented beta blocker date and time   History of Anesthesia Complications Negative for: history of anesthetic complications  Airway Mallampati: III  TM Distance: >3 FB Neck ROM: full    Dental  (+) Teeth Intact   Pulmonary neg pulmonary ROS,    breath sounds clear to auscultation       Cardiovascular hypertension (PIH), Pt. on medications  Rhythm:regular Rate:Normal + Systolic murmurs    Neuro/Psych  Headaches, negative psych ROS   GI/Hepatic negative GI ROS, Neg liver ROS,   Endo/Other  negative endocrine ROS  Renal/GU negative Renal ROS  negative genitourinary   Musculoskeletal   Abdominal   Peds  Hematology  (+) anemia ,   Anesthesia Other Findings   Reproductive/Obstetrics                             Anesthesia Physical  Anesthesia Plan  ASA: II  Anesthesia Plan: General   Post-op Pain Management:    Induction: Intravenous  PONV Risk Score and Plan: 3 and Ondansetron, Dexamethasone, Midazolam and Treatment may vary due to age or medical condition  Airway Management Planned: LMA  Additional Equipment:   Intra-op Plan:   Post-operative Plan: Extubation in OR  Informed Consent: I have reviewed the patients History and Physical, chart, labs and discussed the procedure including the risks, benefits and alternatives for the proposed anesthesia with the patient or authorized representative who has indicated his/her understanding and acceptance.     Dental Advisory Given  Plan Discussed with: Surgeon and CRNA  Anesthesia Plan Comments:         Anesthesia Quick Evaluation

## 2020-06-01 NOTE — Anesthesia Postprocedure Evaluation (Signed)
Anesthesia Post Note  Patient: Lydia Weaver  Procedure(s) Performed: LEFT BREAST LUMPECTOMY (Left Breast)     Patient location during evaluation: PACU Anesthesia Type: General Level of consciousness: awake and alert Pain management: pain level controlled Vital Signs Assessment: post-procedure vital signs reviewed and stable Respiratory status: spontaneous breathing, nonlabored ventilation and respiratory function stable Cardiovascular status: blood pressure returned to baseline and stable Postop Assessment: no apparent nausea or vomiting Anesthetic complications: no   No complications documented.  Last Vitals:  Vitals:   06/01/20 0855 06/01/20 0900  BP: (!) 177/96 (!) 173/102  Pulse:  65  Resp:  18  Temp:  36.9 C  SpO2:  98%    Last Pain:  Vitals:   06/01/20 0900  TempSrc:   PainSc: 0-No pain                 Lynda Rainwater

## 2020-06-02 LAB — SURGICAL PATHOLOGY

## 2020-06-03 ENCOUNTER — Encounter (HOSPITAL_BASED_OUTPATIENT_CLINIC_OR_DEPARTMENT_OTHER): Payer: Self-pay | Admitting: Surgery

## 2020-08-20 ENCOUNTER — Other Ambulatory Visit: Payer: BC Managed Care – PPO

## 2020-08-20 ENCOUNTER — Other Ambulatory Visit: Payer: Self-pay

## 2020-08-20 DIAGNOSIS — Z20822 Contact with and (suspected) exposure to covid-19: Secondary | ICD-10-CM

## 2020-08-22 LAB — NOVEL CORONAVIRUS, NAA: SARS-CoV-2, NAA: DETECTED — AB

## 2020-08-22 LAB — SARS-COV-2, NAA 2 DAY TAT

## 2020-08-23 ENCOUNTER — Encounter: Payer: Self-pay | Admitting: Adult Health

## 2020-08-23 ENCOUNTER — Other Ambulatory Visit (HOSPITAL_COMMUNITY): Payer: Self-pay | Admitting: Adult Health

## 2020-08-23 DIAGNOSIS — U071 COVID-19: Secondary | ICD-10-CM

## 2020-08-23 NOTE — Progress Notes (Signed)
I connected by phone with Lydia Weaver on 08/23/2020 at 4:03 PM to discuss the potential use of a new treatment for mild to moderate COVID-19 viral infection in non-hospitalized patients.  This patient is a 39 y.o. female that meets the FDA criteria for Emergency Use Authorization of COVID monoclonal antibody casirivimab/imdevimab or bamlanivimab/eteseviamb.  Has a (+) direct SARS-CoV-2 viral test result  Has mild or moderate COVID-19   Is NOT hospitalized due to COVID-19  Is within 10 days of symptom onset  Has at least one of the high risk factor(s) for progression to severe COVID-19 and/or hospitalization as defined in EUA.  Specific high risk criteria : BMI > 25   Sx onset 08/17/20   I have spoken and communicated the following to the patient or parent/caregiver regarding COVID monoclonal antibody treatment:  1. FDA has authorized the emergency use for the treatment of mild to moderate COVID-19 in adults and pediatric patients with positive results of direct SARS-CoV-2 viral testing who are 51 years of age and older weighing at least 40 kg, and who are at high risk for progressing to severe COVID-19 and/or hospitalization.  2. The significant known and potential risks and benefits of COVID monoclonal antibody, and the extent to which such potential risks and benefits are unknown.  3. Information on available alternative treatments and the risks and benefits of those alternatives, including clinical trials.  4. Patients treated with COVID monoclonal antibody should continue to self-isolate and use infection control measures (e.g., wear mask, isolate, social distance, avoid sharing personal items, clean and disinfect "high touch" surfaces, and frequent handwashing) according to CDC guidelines.   5. The patient or parent/caregiver has the option to accept or refuse COVID monoclonal antibody treatment.  After reviewing this information with the patient, the patient has agreed to  receive one of the available covid 19 monoclonal antibodies and will be provided an appropriate fact sheet prior to infusion. Scot Dock, NP 08/23/2020 4:03 PM

## 2020-08-24 ENCOUNTER — Ambulatory Visit (HOSPITAL_COMMUNITY)
Admission: RE | Admit: 2020-08-24 | Discharge: 2020-08-24 | Disposition: A | Discharge status: Home / Self Care | Payer: BC Managed Care – PPO | Source: Ambulatory Visit | Attending: Pulmonary Disease | Admitting: Pulmonary Disease

## 2020-08-24 DIAGNOSIS — U071 COVID-19: Secondary | ICD-10-CM | POA: Diagnosis not present

## 2020-08-24 MED ORDER — SODIUM CHLORIDE 0.9 % IV SOLN: INTRAVENOUS | Status: DC | PRN

## 2020-08-24 MED ORDER — SODIUM CHLORIDE 0.9 % IV SOLN
1200.0000 mg | Freq: Once | INTRAVENOUS | Status: AC
  Administered 2020-08-24: 1200 mg via INTRAVENOUS

## 2020-08-24 MED ORDER — FAMOTIDINE IN NACL 20-0.9 MG/50ML-% IV SOLN: 20.0000 mg | Freq: Once | INTRAVENOUS | Status: DC | PRN

## 2020-08-24 MED ORDER — METHYLPREDNISOLONE SODIUM SUCC 125 MG IJ SOLR: 125.0000 mg | Freq: Once | INTRAMUSCULAR | Status: DC | PRN

## 2020-08-24 MED ORDER — EPINEPHRINE 0.3 MG/0.3ML IJ SOAJ: 0.3000 mg | Freq: Once | INTRAMUSCULAR | Status: DC | PRN

## 2020-08-24 MED ORDER — DIPHENHYDRAMINE HCL 50 MG/ML IJ SOLN: 50.0000 mg | Freq: Once | INTRAMUSCULAR | Status: DC | PRN

## 2020-08-24 MED ORDER — ALBUTEROL SULFATE HFA 108 (90 BASE) MCG/ACT IN AERS: 2.0000 | INHALATION_SPRAY | Freq: Once | RESPIRATORY_TRACT | Status: DC | PRN

## 2020-08-24 NOTE — Progress Notes (Signed)
  Diagnosis: COVID-19  Physician: Dr. Asencion Noble  Procedure: [facility] Infusion Clinic Med: casirivimab\imdevimab infusion - Provided patient with casirivimab\imdevimab fact sheet for patients, parents and caregivers prior to infusion.  Complications: No immediate complications noted.  Discharge: Discharged home   Lydia Weaver 08/24/2020

## 2020-08-24 NOTE — Discharge Instructions (Signed)

## 2020-10-04 ENCOUNTER — Other Ambulatory Visit: Payer: BC Managed Care – PPO

## 2020-10-05 ENCOUNTER — Other Ambulatory Visit: Payer: BC Managed Care – PPO

## 2020-10-26 ENCOUNTER — Other Ambulatory Visit: Payer: BC Managed Care – PPO

## 2020-10-26 DIAGNOSIS — Z20822 Contact with and (suspected) exposure to covid-19: Secondary | ICD-10-CM

## 2020-10-28 LAB — SARS-COV-2, NAA 2 DAY TAT

## 2020-10-28 LAB — NOVEL CORONAVIRUS, NAA: SARS-CoV-2, NAA: NOT DETECTED

## 2020-11-11 ENCOUNTER — Other Ambulatory Visit: Payer: Self-pay

## 2020-11-11 ENCOUNTER — Other Ambulatory Visit: Payer: BC Managed Care – PPO

## 2020-11-11 DIAGNOSIS — Z20822 Contact with and (suspected) exposure to covid-19: Secondary | ICD-10-CM

## 2020-11-12 LAB — SARS-COV-2, NAA 2 DAY TAT

## 2020-11-12 LAB — NOVEL CORONAVIRUS, NAA: SARS-CoV-2, NAA: NOT DETECTED

## 2020-12-08 ENCOUNTER — Other Ambulatory Visit: Payer: BC Managed Care – PPO

## 2020-12-08 DIAGNOSIS — Z20822 Contact with and (suspected) exposure to covid-19: Secondary | ICD-10-CM

## 2020-12-10 LAB — NOVEL CORONAVIRUS, NAA: SARS-CoV-2, NAA: NOT DETECTED

## 2020-12-10 LAB — SARS-COV-2, NAA 2 DAY TAT

## 2021-02-14 ENCOUNTER — Other Ambulatory Visit: Payer: BC Managed Care – PPO

## 2022-02-20 IMAGING — MG MM BREAST LOCALIZATION CLIP
4 series · 4 of 12 positions shown · non-contrast
Comparison: Previous exam(s).

CLINICAL DATA: 39-year-old female presenting for biopsy of three
left breast masses.

EXAM:
DIAGNOSTIC LEFT MAMMOGRAM POST ULTRASOUND BIOPSY

[L ML synth-2D]
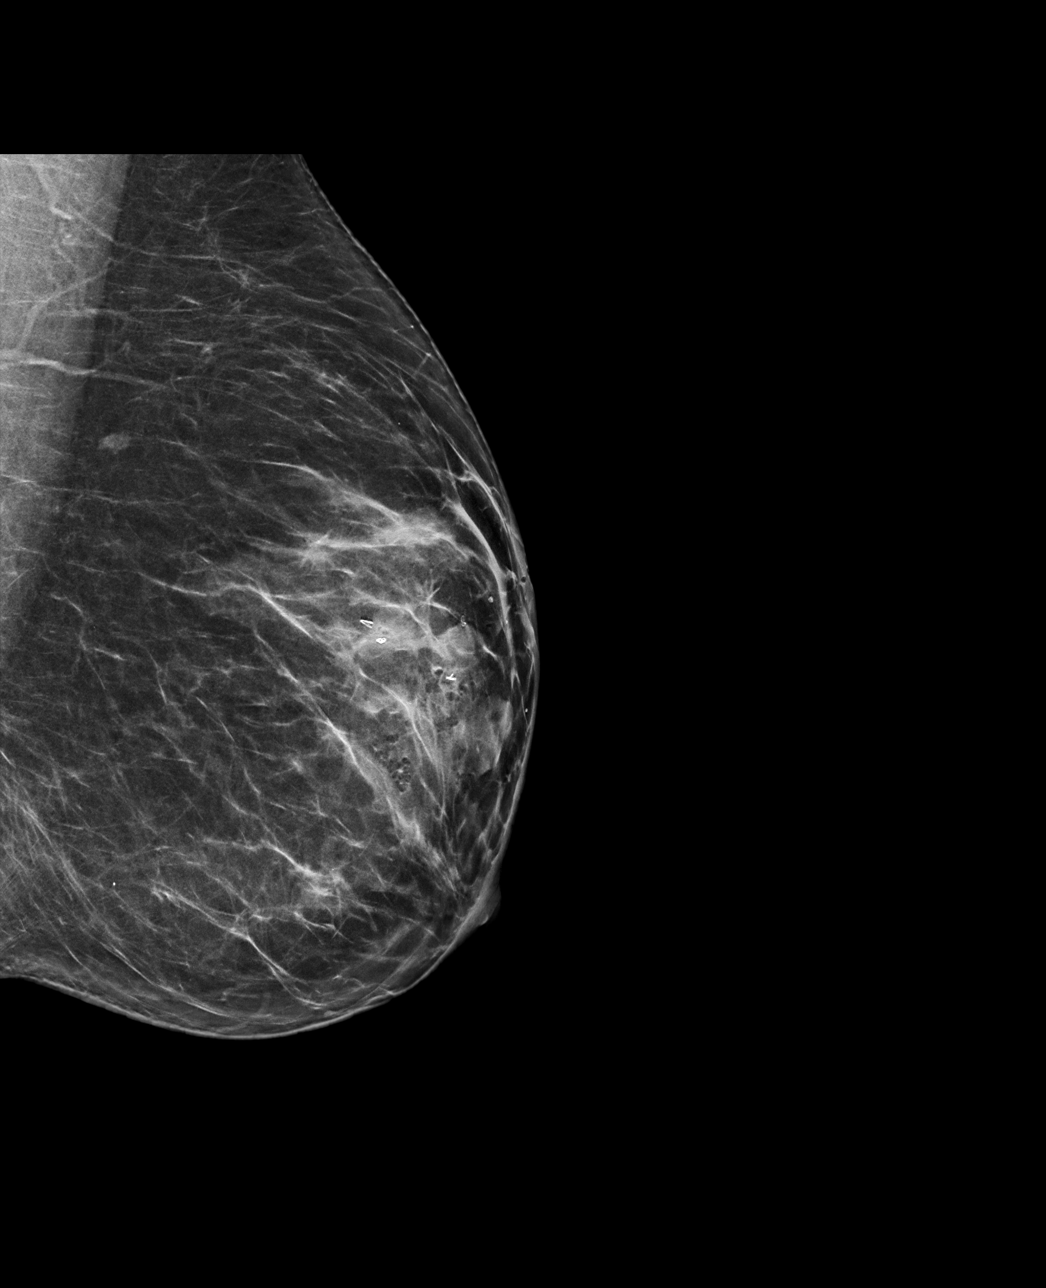

[L CC synth-2D]
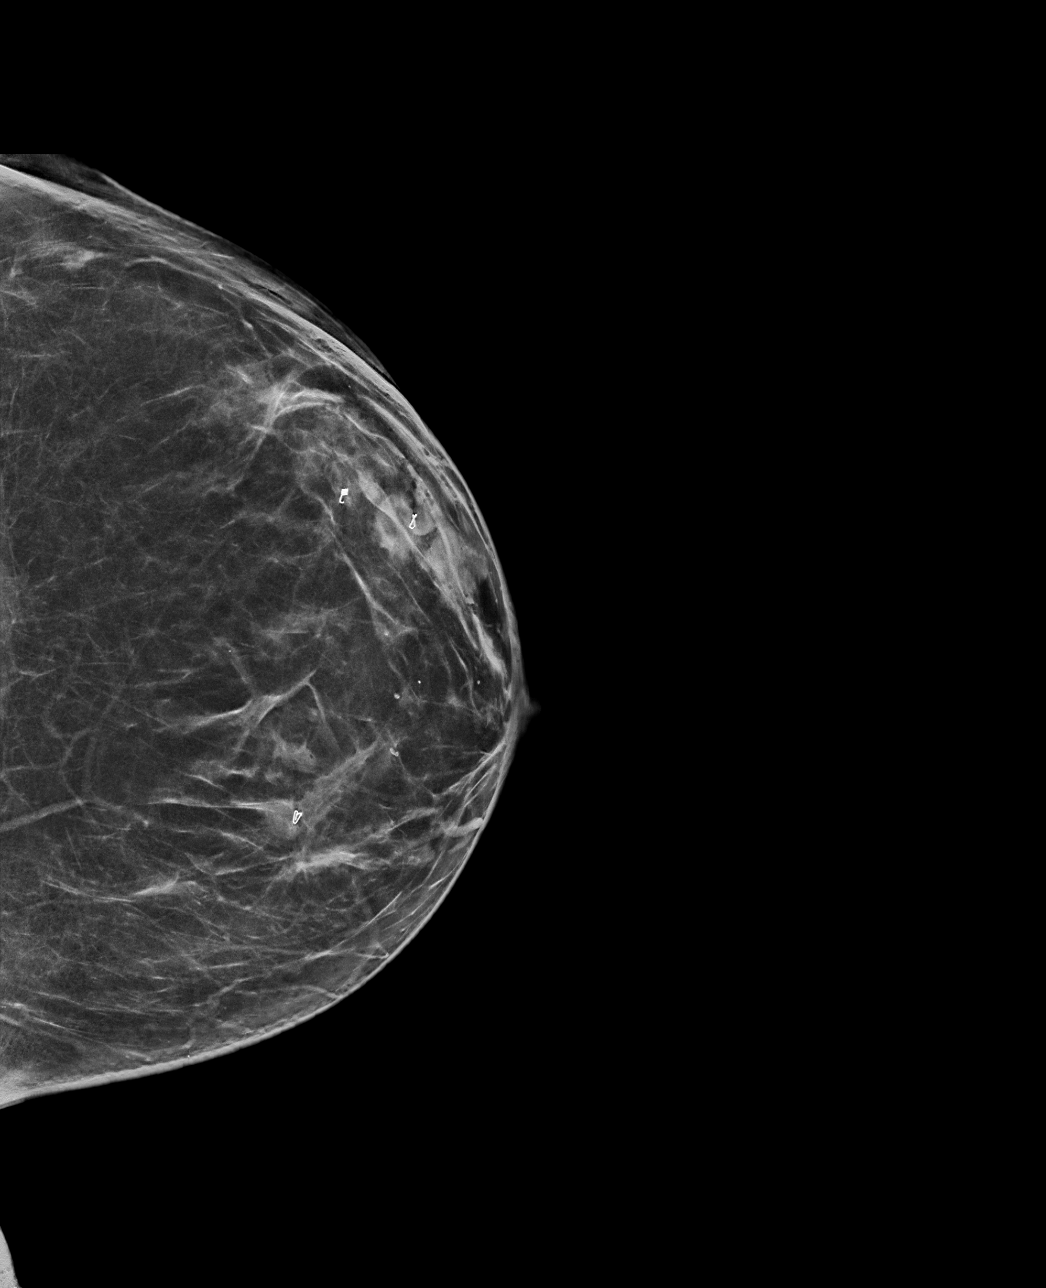

[L ML tomo · tomo slice 39/77.0]
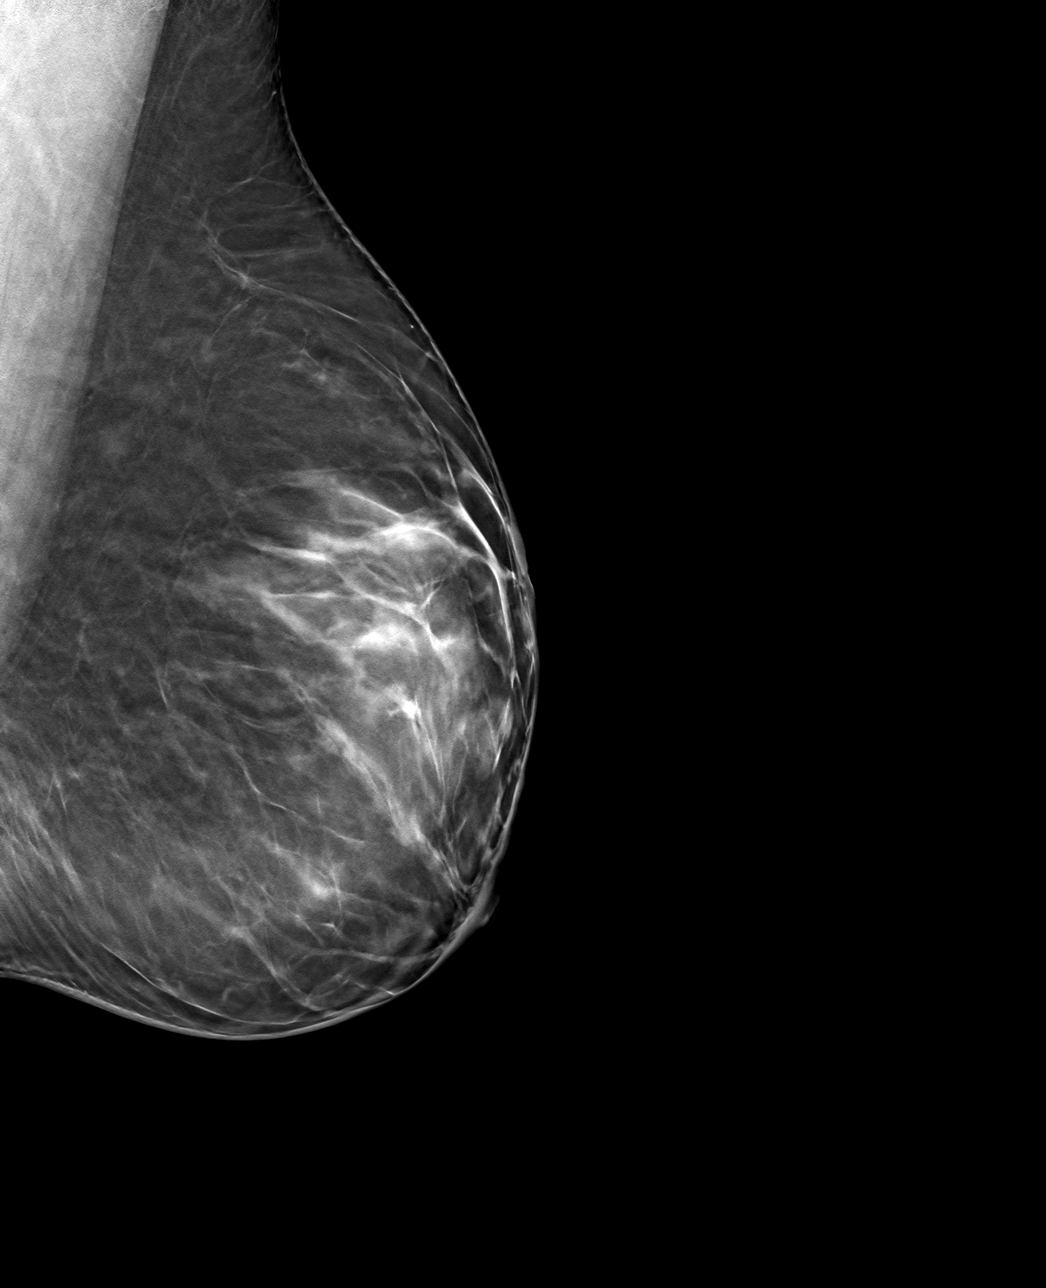

[L CC tomo · tomo slice 39/76.0]
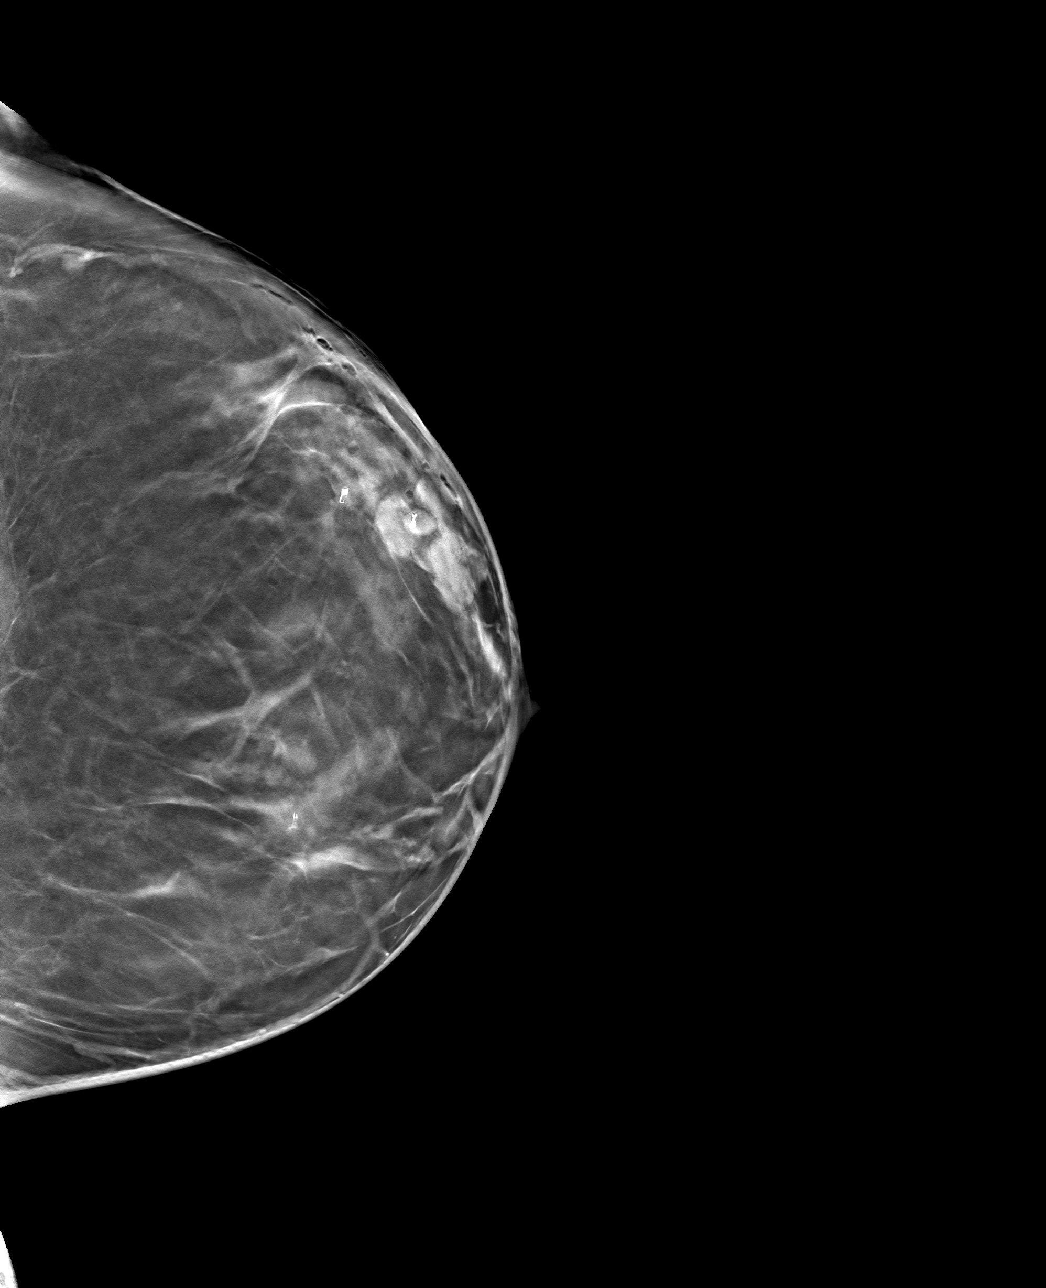

[4 of 12 positions shown; findings below may reference images not displayed]

FINDINGS: Mammographic images were obtained following ultrasound guided biopsy
of a left breast mass at 1 o'clock 3 cm from the nipple. The ribbon
biopsy marking clip is in expected position at the site of biopsy.

Mammographic images were obtained following ultrasound guided biopsy
of a left breast mass at 1 o'clock 4 cm from the nipple. The coil
biopsy marking clip is in expected position at the site of biopsy.

Mammographic images were obtained following ultrasound guided biopsy
of a left breast mass at 11 o'clock. The heart biopsy marking clip
is in expected position at the site of biopsy.
IMPRESSION: Appropriate positioning of the ribbon shaped biopsy marking clip at
the site of biopsy in the left breast at 1 o'clock 3 cm from the
nipple.

Appropriate positioning of the coil shaped biopsy marking clip at
the site of biopsy in the left breast at 1 o'clock 4 cm from the
nipple.

Appropriate positioning of the heart shaped biopsy marking clip at
the site of biopsy in the left breast at 11 o'clock.

Final Assessment: Post Procedure Mammograms for Marker Placement

## 2022-02-20 IMAGING — US US BREAST BX W LOC DEV 1ST LESION IMG BX SPEC US GUIDE*L*
1 series · 16 of 25 positions shown · non-contrast
Comparison: Previous exam(s).
COMPARISON: Previous exam(s).

Addendum:
CLINICAL DATA: 39-year-old female presenting for biopsy of three
left breast masses.

EXAM:
ULTRASOUND GUIDED LEFT BREAST CORE NEEDLE BIOPSY x3

[Series 1: us breast bx w loc dev 1st lesion img bx spec us g · 0.06mm/px · 16 of 28 slices shown]
[im 1/28]
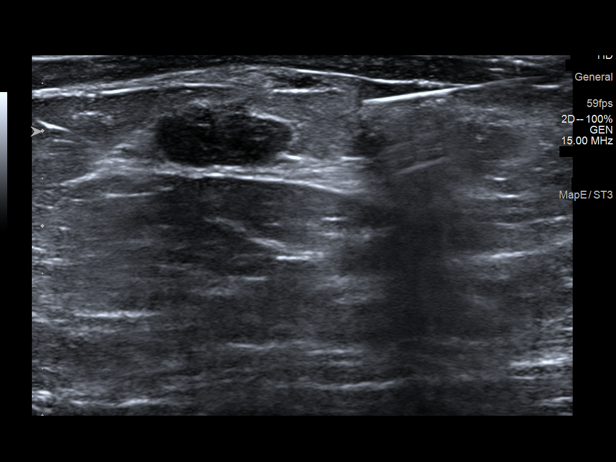
[im 3/28]
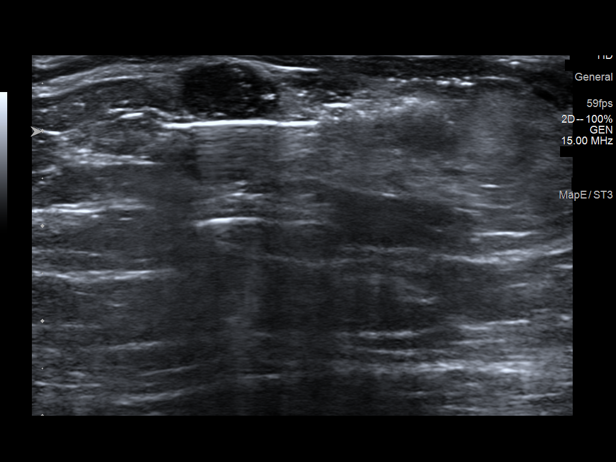
[im 4/28]
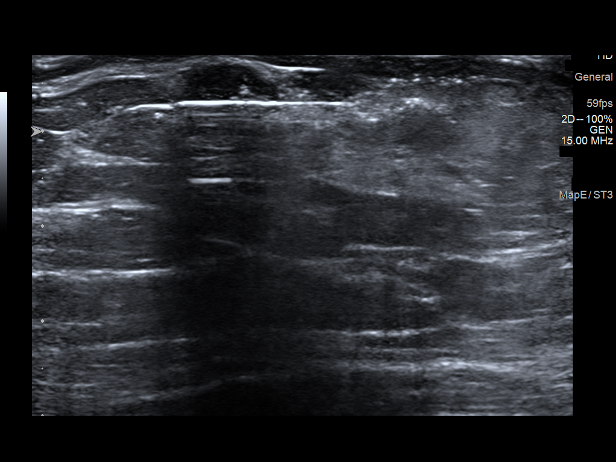
[im 6/28]
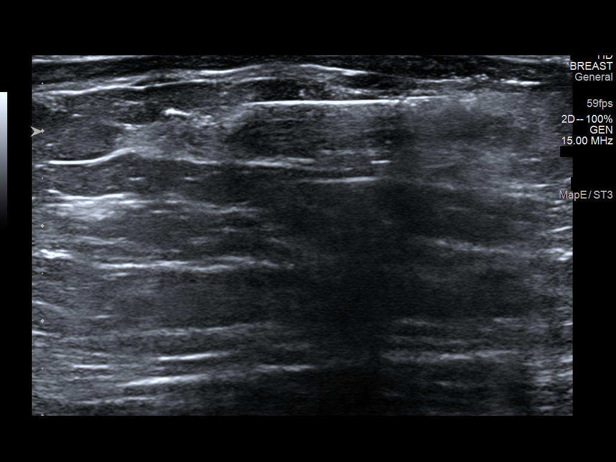
[im 8/28]
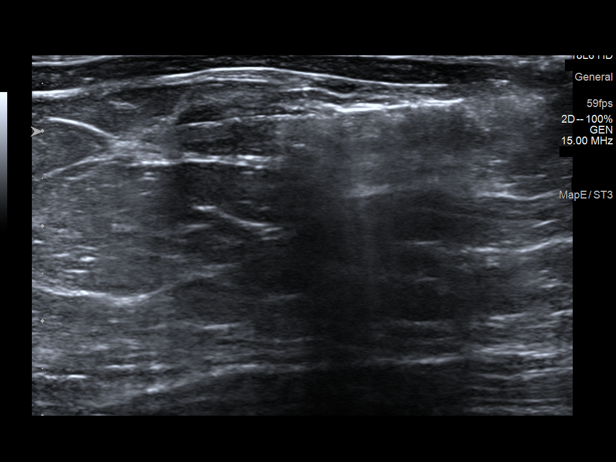
[im 10/28]
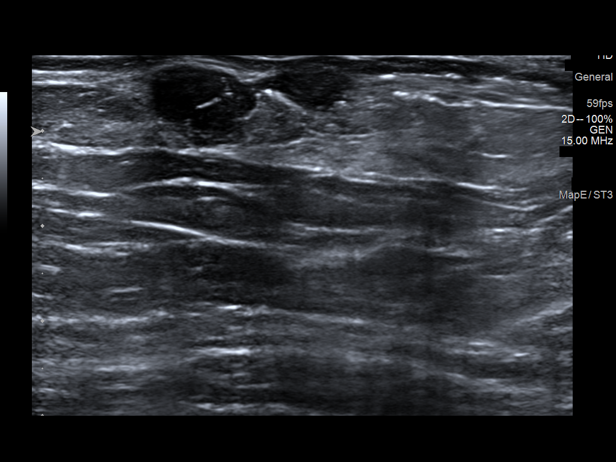
[im 12/28]
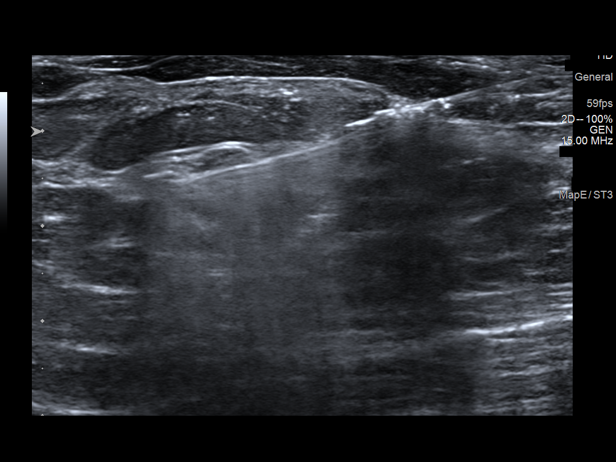
[im 13/28]
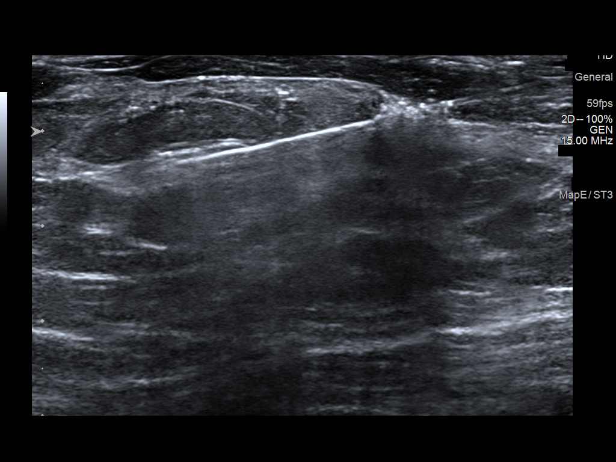
[im 15/28]
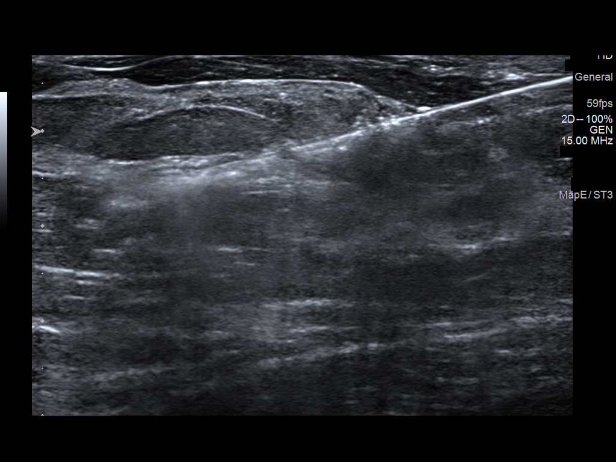
[im 16/28]
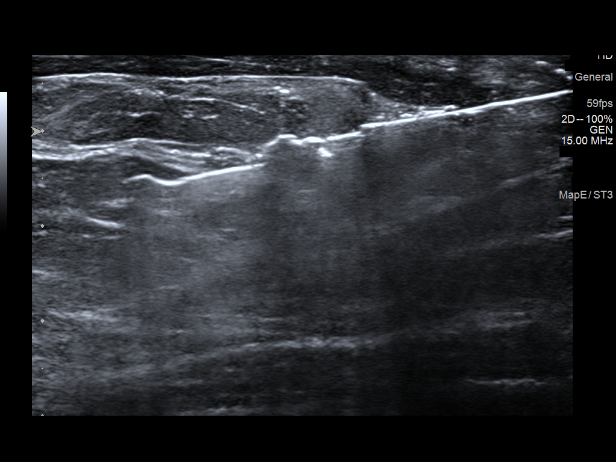
[im 19/28]
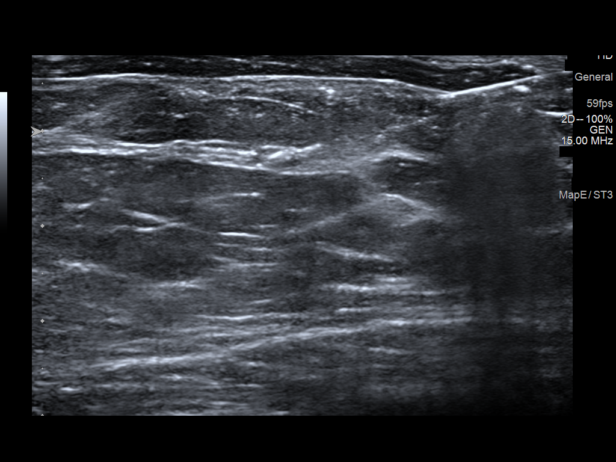
[im 20/28]
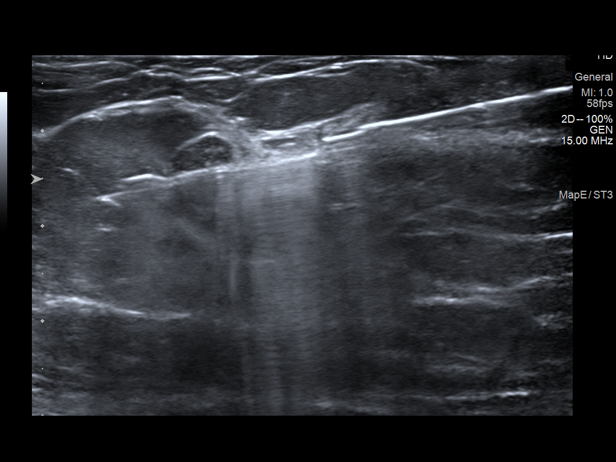
[im 22/28]
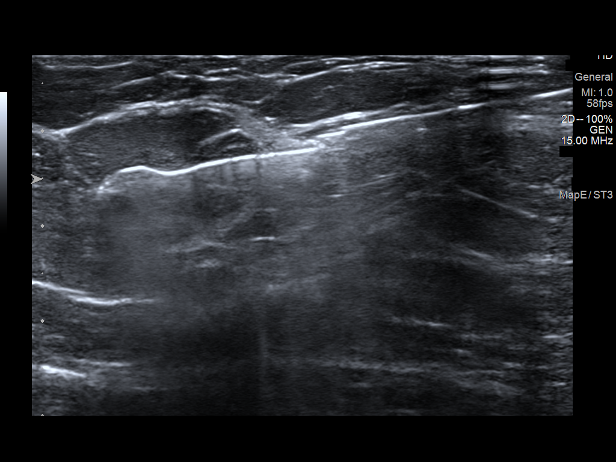
[im 24/28]
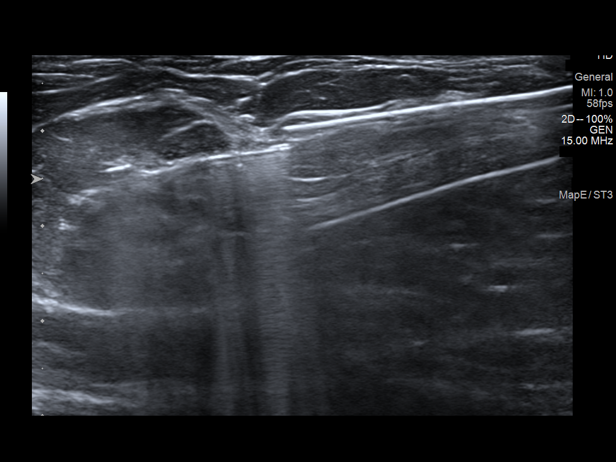
[im 25/28]
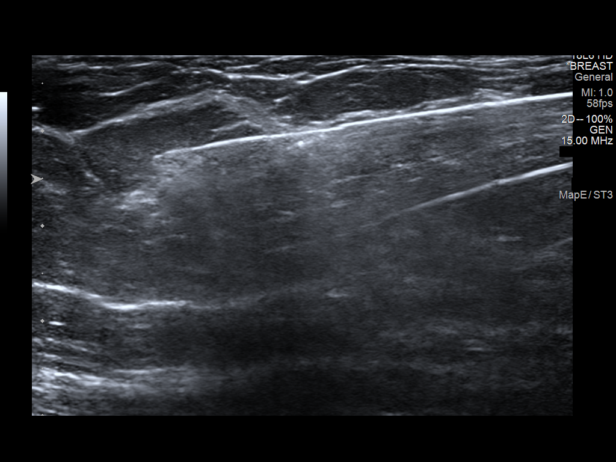
[im 28/28]
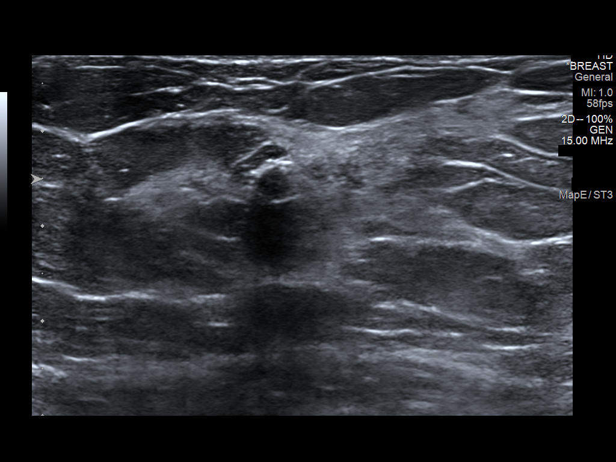

[16 of 25 positions shown; findings below may reference images not displayed]



Lesion quadrant: Upper outer quadrant

Using sterile technique and 1% Lidocaine as local anesthetic, under
direct ultrasound visualization, a 12 gauge Ourari device was
used to perform biopsy of a left breast mass at 1 o'clock 3 cm from
the nipple using a lateral approach. At the conclusion of the
procedure a ribbon tissue marker clip was deployed into the biopsy
cavity. Follow up 2 view mammogram was performed and dictated
separately.

Lesion quadrant: Upper outer quadrant

Using sterile technique and 1% Lidocaine as local anesthetic, under
direct ultrasound visualization, a 14 gauge Ourari device was
used to perform biopsy of a left breast mass at 1 o'clock 4 cm from
the nipple using a lateral approach. At the conclusion of the
procedure a coil tissue marker clip was deployed into the biopsy
cavity. Follow up 2 view mammogram was performed and dictated
separately.

Lesion quadrant: Upper inner quadrant

Using sterile technique and 1% Lidocaine as local anesthetic, under
direct ultrasound visualization, a 14 gauge Ourari device was
used to perform biopsy of a left breast mass at 11 o'clock using a
lateral approach. At the conclusion of the procedure a heart tissue
marker clip was deployed into the biopsy cavity. Follow up 2 view
mammogram was performed and dictated separately.
IMPRESSION: Ultrasound guided biopsy of three left breast masses as above. No
apparent complications.

ADDENDUM:
Pathology revealed FIBROADENOMA of the LEFT breast, 1 o'clock,
6cmfn. This was found to be discordant by Dr. Venita Shuman, with
excision recommended.

Pathology revealed FIBROADENOMA of the LEFT breast, 1 o'clock,
2cmfn. This was found to be concordant by Dr. Venita Shuman.

Pathology revealed FIBROADENOMA of the LEFT breast, 11 o'clock. This
was found to be concordant by Dr. Venita Shuman.

Pathology results were discussed with the patient by telephone. The
patient reported doing well after the biopsies with tenderness at
the sites. Post biopsy instructions and care were reviewed and
questions were answered. The patient was encouraged to call The

Surgical consultation has been arranged with Dr. Eunice Joy Illustrisimo at
[[email or web address]] on April 05, 2020.

Pathology results reported by Tinnkaa Debelak RN on 03/12/2020.



Lesion quadrant: Upper outer quadrant

Using sterile technique and 1% Lidocaine as local anesthetic, under
direct ultrasound visualization, a 12 gauge Ourari device was
used to perform biopsy of a left breast mass at 1 o'clock 3 cm from
the nipple using a lateral approach. At the conclusion of the
procedure a ribbon tissue marker clip was deployed into the biopsy
cavity. Follow up 2 view mammogram was performed and dictated
separately.

Lesion quadrant: Upper outer quadrant

Using sterile technique and 1% Lidocaine as local anesthetic, under
direct ultrasound visualization, a 14 gauge Ourari device was
used to perform biopsy of a left breast mass at 1 o'clock 4 cm from
the nipple using a lateral approach. At the conclusion of the
procedure a coil tissue marker clip was deployed into the biopsy
cavity. Follow up 2 view mammogram was performed and dictated
separately.

Lesion quadrant: Upper inner quadrant

Using sterile technique and 1% Lidocaine as local anesthetic, under
direct ultrasound visualization, a 14 gauge Ourari device was
used to perform biopsy of a left breast mass at 11 o'clock using a
lateral approach. At the conclusion of the procedure a heart tissue
marker clip was deployed into the biopsy cavity. Follow up 2 view
mammogram was performed and dictated separately.
IMPRESSION: Ultrasound guided biopsy of three left breast masses as above. No
apparent complications.
